# Patient Record
Sex: Female | Born: 1985 | Race: White | Hispanic: No | Marital: Single | Smoking: Former smoker
Health system: Southern US, Community
[De-identification: ages and names within clinical notes are randomized; demographics above are authoritative.]

## PROBLEM LIST (undated history)

## (undated) DIAGNOSIS — Z8052 Family history of malignant neoplasm of bladder: Secondary | ICD-10-CM

## (undated) DIAGNOSIS — N760 Acute vaginitis: Secondary | ICD-10-CM

## (undated) DIAGNOSIS — F4310 Post-traumatic stress disorder, unspecified: Secondary | ICD-10-CM

## (undated) DIAGNOSIS — Z801 Family history of malignant neoplasm of trachea, bronchus and lung: Secondary | ICD-10-CM

## (undated) DIAGNOSIS — F329 Major depressive disorder, single episode, unspecified: Secondary | ICD-10-CM

## (undated) DIAGNOSIS — Z800 Family history of malignant neoplasm of digestive organs: Secondary | ICD-10-CM

## (undated) DIAGNOSIS — F419 Anxiety disorder, unspecified: Secondary | ICD-10-CM

## (undated) DIAGNOSIS — Z808 Family history of malignant neoplasm of other organs or systems: Secondary | ICD-10-CM

## (undated) DIAGNOSIS — Z8049 Family history of malignant neoplasm of other genital organs: Secondary | ICD-10-CM

## (undated) DIAGNOSIS — F909 Attention-deficit hyperactivity disorder, unspecified type: Secondary | ICD-10-CM

## (undated) DIAGNOSIS — F319 Bipolar disorder, unspecified: Secondary | ICD-10-CM

## (undated) DIAGNOSIS — F32A Depression, unspecified: Secondary | ICD-10-CM

## (undated) DIAGNOSIS — Z803 Family history of malignant neoplasm of breast: Secondary | ICD-10-CM

## (undated) DIAGNOSIS — N879 Dysplasia of cervix uteri, unspecified: Secondary | ICD-10-CM

## (undated) DIAGNOSIS — Z806 Family history of leukemia: Secondary | ICD-10-CM

## (undated) DIAGNOSIS — T7840XA Allergy, unspecified, initial encounter: Secondary | ICD-10-CM

## (undated) DIAGNOSIS — E039 Hypothyroidism, unspecified: Secondary | ICD-10-CM

## (undated) DIAGNOSIS — B9689 Other specified bacterial agents as the cause of diseases classified elsewhere: Secondary | ICD-10-CM

## (undated) HISTORY — DX: Major depressive disorder, single episode, unspecified: F32.9

## (undated) HISTORY — DX: Family history of malignant neoplasm of trachea, bronchus and lung: Z80.1

## (undated) HISTORY — DX: Allergy, unspecified, initial encounter: T78.40XA

## (undated) HISTORY — DX: Bipolar disorder, unspecified: F31.9

## (undated) HISTORY — DX: Family history of malignant neoplasm of digestive organs: Z80.0

## (undated) HISTORY — DX: Post-traumatic stress disorder, unspecified: F43.10

## (undated) HISTORY — DX: Family history of leukemia: Z80.6

## (undated) HISTORY — DX: Family history of malignant neoplasm of breast: Z80.3

## (undated) HISTORY — DX: Depression, unspecified: F32.A

## (undated) HISTORY — DX: Hypothyroidism, unspecified: E03.9

## (undated) HISTORY — DX: Attention-deficit hyperactivity disorder, unspecified type: F90.9

## (undated) HISTORY — DX: Family history of malignant neoplasm of other genital organs: Z80.49

## (undated) HISTORY — DX: Dysplasia of cervix uteri, unspecified: N87.9

## (undated) HISTORY — DX: Anxiety disorder, unspecified: F41.9

## (undated) HISTORY — DX: Other specified bacterial agents as the cause of diseases classified elsewhere: B96.89

## (undated) HISTORY — PX: TUBAL LIGATION: SHX77

## (undated) HISTORY — DX: Other specified bacterial agents as the cause of diseases classified elsewhere: N76.0

## (undated) HISTORY — DX: Family history of malignant neoplasm of bladder: Z80.52

## (undated) HISTORY — DX: Family history of malignant neoplasm of other organs or systems: Z80.8

---

## 2007-07-17 DIAGNOSIS — K089 Disorder of teeth and supporting structures, unspecified: Secondary | ICD-10-CM | POA: Insufficient documentation

## 2013-06-14 ENCOUNTER — Encounter (HOSPITAL_COMMUNITY): Payer: Self-pay

## 2013-06-14 ENCOUNTER — Ambulatory Visit (HOSPITAL_COMMUNITY)
Admission: RE | Admit: 2013-06-14 | Discharge: 2013-06-14 | Disposition: A | Discharge status: Home / Self Care | Payer: Medicaid Other | Source: Ambulatory Visit | Attending: Unknown Physician Specialty | Admitting: Unknown Physician Specialty

## 2013-06-14 ENCOUNTER — Other Ambulatory Visit (HOSPITAL_COMMUNITY): Payer: Self-pay | Admitting: Unknown Physician Specialty

## 2013-06-14 ENCOUNTER — Other Ambulatory Visit: Payer: Self-pay

## 2013-06-14 ENCOUNTER — Ambulatory Visit (HOSPITAL_COMMUNITY)
Admission: RE | Admit: 2013-06-14 | Discharge: 2013-06-14 | Disposition: A | Discharge status: Home / Self Care | Payer: Medicaid Other | Source: Ambulatory Visit | Attending: Obstetrics and Gynecology | Admitting: Obstetrics and Gynecology

## 2013-06-14 DIAGNOSIS — E079 Disorder of thyroid, unspecified: Secondary | ICD-10-CM | POA: Insufficient documentation

## 2013-06-14 DIAGNOSIS — O368390 Maternal care for abnormalities of the fetal heart rate or rhythm, unspecified trimester, not applicable or unspecified: Secondary | ICD-10-CM

## 2013-06-14 DIAGNOSIS — O99280 Endocrine, nutritional and metabolic diseases complicating pregnancy, unspecified trimester: Principal | ICD-10-CM

## 2013-06-14 DIAGNOSIS — Z3689 Encounter for other specified antenatal screening: Secondary | ICD-10-CM | POA: Insufficient documentation

## 2013-06-14 DIAGNOSIS — E0590 Thyrotoxicosis, unspecified without thyrotoxic crisis or storm: Secondary | ICD-10-CM | POA: Insufficient documentation

## 2013-06-14 NOTE — Consult Note (Signed)
MFM Staff Consultation Note  Discussion: This patient is referred for evaluation for possible hyperthyroidism.  Detailed review of thyroid function panel drawn on 05/24/13 demonstrates the following:     Mildly low TSH of 0.427 (normal 0.450-4.50) and normal free thyroxine index (free T4) of 2.6 (normal 1.2-4.9).  These results are consistent with subclinical hyperthyroidism and may be related to HCG-effects of pregnancy.  Moreover, subclinical hyperthyroidism has been reported in 1.7% of pregnant women and is characterized by an abnormally low serum TSH concentration with free T4 levels within the normal reference range. Importantly, it has not been associated with adverse pregnancy outcomes. Because antithyroid medication crosses the placenta and could theoretically have adverse fetal or neonatal effects, treatment of pregnant women with subclinical hyperthyroidism is not warranted.   Impressions: SIUP at 6368w2d in gestation complicated by subclinical hyperthyroidism (low TSH, normal free T4)   Comprehensive fetal survey EFW 28th%, AC 6th%,  symmetric fetal growth pattern with HC/AC 1.09 UA Doppler 43rd% AFI is 9cm (low normal) No dysmorphic features    Recommendations: 1. Taking methimazole is not recommended at this time.  Patient advised not to take the medication. 2. trends in fetal growth lead me to recommend close interval follow up with reassessment of growth/AFI and Doppler only if needed in 2 weeks. 3. will repeat TSH and free T4 at that time to assess for onset of clinical hyperthyroidism onset (~5 weeks since the last draw, given that it typically takes 5-6 half lives for change in TSH/fT4; last draw 05/24/13).  If values remain subclinical and there are no overt signs of hyperthyroidism, I would state that thyroid function need not be reassessed unless clinically warranted until 4-6 weeks postpartum.  Time Spent: I spent in excess of 30 minutes in consultation with this patient  to review records, evaluate her case, and provide her with an adequate discussion and education.  More than 50% of this time was spent in direct face-to-face counseling. It was a pleasure seeing your patient in the office today.  Thank you for consultation. Please do not hesitate to contact our service for any further questions.   Thank you,  Louann SjogrenJeffrey Morgan Gaynelle Arabianenney   Orvella Digiulio, Louann SjogrenJeffrey Morgan, MD, MS, FACOG Assistant Professor Section of Maternal-Fetal Medicine [facility]

## 2013-06-18 ENCOUNTER — Other Ambulatory Visit (HOSPITAL_COMMUNITY): Payer: Self-pay | Admitting: Obstetrics and Gynecology

## 2013-06-18 DIAGNOSIS — E079 Disorder of thyroid, unspecified: Secondary | ICD-10-CM

## 2013-06-18 DIAGNOSIS — O99280 Endocrine, nutritional and metabolic diseases complicating pregnancy, unspecified trimester: Principal | ICD-10-CM

## 2013-06-28 ENCOUNTER — Ambulatory Visit (HOSPITAL_COMMUNITY): Admission: RE | Admit: 2013-06-28 | Payer: Medicaid Other | Source: Ambulatory Visit

## 2013-06-28 ENCOUNTER — Ambulatory Visit (HOSPITAL_COMMUNITY)
Admission: RE | Admit: 2013-06-28 | Discharge: 2013-06-28 | Disposition: A | Discharge status: Home / Self Care | Payer: Medicaid Other | Source: Ambulatory Visit | Attending: Unknown Physician Specialty | Admitting: Unknown Physician Specialty

## 2013-06-28 DIAGNOSIS — Z3689 Encounter for other specified antenatal screening: Secondary | ICD-10-CM | POA: Insufficient documentation

## 2013-06-28 DIAGNOSIS — E079 Disorder of thyroid, unspecified: Secondary | ICD-10-CM | POA: Insufficient documentation

## 2013-06-28 DIAGNOSIS — E0590 Thyrotoxicosis, unspecified without thyrotoxic crisis or storm: Secondary | ICD-10-CM | POA: Insufficient documentation

## 2013-06-28 DIAGNOSIS — O99280 Endocrine, nutritional and metabolic diseases complicating pregnancy, unspecified trimester: Principal | ICD-10-CM

## 2013-06-28 NOTE — Progress Notes (Signed)
[facility]  Indication: 28 yr old [id number] at 3116w2d with subclinical hyperthyroidism for follow up ultrasound for growth and complete anatomy.  Findings: 1. Single intrauterine pregnancy. 2. Estimated fetal weight is in the 29th%. 3. Anterior placenta without evidence of previa. 4. Normal amniotic fluid index. 5. The view of the abdominal cord insertion remains limited. 6. The remainder of the limited anatomy survey is normal. Any anatomy not evaluated on today's exam was evaluated on the previous exam.  Recommendations: 1. Appropriate fetal growth. 2. Normal limited anatomy survey. 3. Subclinical hyperthyroidism: - previously counseled - had repeat TFTs with primary OB- please call with questions - if free T4 remains normal no medical therapy indicated; would continue to follow every 4 weeks 3. Recommend fetal growth in 3-4 weeks  Eulis FosterKristen Antonia Culbertson, MD

## 2013-07-03 ENCOUNTER — Other Ambulatory Visit (HOSPITAL_COMMUNITY): Payer: Self-pay | Admitting: Obstetrics and Gynecology

## 2013-07-03 DIAGNOSIS — O99280 Endocrine, nutritional and metabolic diseases complicating pregnancy, unspecified trimester: Principal | ICD-10-CM

## 2013-07-03 DIAGNOSIS — E079 Disorder of thyroid, unspecified: Secondary | ICD-10-CM

## 2013-07-23 ENCOUNTER — Ambulatory Visit (HOSPITAL_COMMUNITY): Payer: Medicaid Other

## 2013-12-30 ENCOUNTER — Encounter (HOSPITAL_COMMUNITY): Payer: Self-pay

## 2014-04-19 ENCOUNTER — Encounter (HOSPITAL_COMMUNITY): Payer: Self-pay | Admitting: *Deleted

## 2018-01-09 ENCOUNTER — Encounter: Payer: Self-pay | Admitting: Internal Medicine

## 2018-01-09 ENCOUNTER — Ambulatory Visit: Payer: Medicaid Other | Admitting: Internal Medicine

## 2018-01-09 VITALS — BP 118/74 | HR 96 | Ht 65.0 in | Wt 156.0 lb

## 2018-01-09 DIAGNOSIS — N760 Acute vaginitis: Secondary | ICD-10-CM | POA: Diagnosis present

## 2018-01-09 DIAGNOSIS — B9689 Other specified bacterial agents as the cause of diseases classified elsewhere: Secondary | ICD-10-CM

## 2018-01-09 MED ORDER — TINIDAZOLE 500 MG PO TABS: 1.0000 g | ORAL_TABLET | Freq: Every day | ORAL | 3 refills | Status: AC

## 2018-01-09 NOTE — Patient Instructions (Signed)
Please use female condoms when having sex.  Abstain from sex during your menstrual cycle  If having symptoms, use tinidazole prescription

## 2018-01-09 NOTE — Progress Notes (Signed)
Recently finished Flagyl one week ago.  Bacterial Vaginitis s/s ongoing for +3years

## 2018-01-14 NOTE — Progress Notes (Signed)
RFV: referral for management of recurrent BV  Patient ID: Allison Nunez, female   DOB: 1985-04-07, 32 y.o.   MRN: [id number]  HPI 32yo F who reports that she has had ongoing intermittent recurrence of BV for the past 3 years. Has had multiple courses of metronidazole, tinidazole in order to treat BV . Often recurs by the next menstrual cycle. She feels that her symptoms occur about her menstrual cycle. She did not tolerate clindamycin or boric acid for which she did not complete. She has been with same partner for the past 6 yrs they have sex twice a week and often when she has her menstrual cycle. They do not use condoms since her female partner prefers not to use them. Referred here for other treatment course alternatives. Has had recent sti work up which is negative  I have reviewed her clinic notes   Outpatient Encounter Medications as of 01/09/2018  Medication Sig  . ARIPiprazole (ABILIFY) 2 MG tablet Take 1 tablet by mouth once.  . tinidazole (TINDAMAX) 500 MG tablet Take 2 tablets (1,000 mg total) by mouth daily with breakfast for 5 days.   No facility-administered encounter medications on file as of 01/09/2018.      There are no active problems to display for this patient.    Health Maintenance Due  Topic Date Due  . HIV Screening  08/12/2002  . TETANUS/TDAP  08/12/2006  . PAP SMEAR  08/11/2008  . INFLUENZA VACCINE  09/28/2017    family history is not on file.  Social History   Tobacco Use  . Smoking status: Heavy Tobacco Smoker    Packs/day: 1.00    Years: 16.00    Pack years: 16.00    Types: Cigarettes  . Smokeless tobacco: Never Used  Substance Use Topics  . Alcohol use: Not on file  . Drug use: Not on file   Review of Systems Review of Systems  Constitutional: Negative for fever, chills, diaphoresis, activity change, appetite change, fatigue and unexpected weight change.  HENT: Negative for congestion, sore throat, rhinorrhea, sneezing, trouble swallowing and  sinus pressure.  Eyes: Negative for photophobia and visual disturbance.  Respiratory: Negative for cough, chest tightness, shortness of breath, wheezing and stridor.  Cardiovascular: Negative for chest pain, palpitations and leg swelling.  Gastrointestinal: Negative for nausea, vomiting, abdominal pain, diarrhea, constipation, blood in stool, abdominal distention and anal bleeding.  Genitourinary: Negative for dysuria, hematuria, flank pain and difficulty urinating.  Musculoskeletal: Negative for myalgias, back pain, joint swelling, arthralgias and gait problem.  Skin: Negative for color change, pallor, rash and wound.  Neurological: Negative for dizziness, tremors, weakness and light-headedness.  Hematological: Negative for adenopathy. Does not bruise/bleed easily.  Psychiatric/Behavioral: Negative for behavioral problems, confusion, sleep disturbance, dysphoric mood, decreased concentration and agitation.    Physical Exam   BP 118/74   Pulse 96   Ht 5\' 5"  (1.651 m)   Wt 156 lb (70.8 kg)   LMP 12/17/2017   BMI 25.96 kg/m   Physical Exam  Constitutional:  oriented to person, place, and time. appears well-developed and well-nourished. No distress.  HENT: [place]/AT, PERRLA, no scleral icterus Mouth/Throat: Oropharynx is clear and moist. No oropharyngeal exudate.  Cardiovascular: Normal rate, regular rhythm and normal heart sounds. Exam reveals no gallop and no friction rub.  No murmur heard.  Pulmonary/Chest: Effort normal and breath sounds normal. No respiratory distress.  has no wheezes.  Neck = supple, no nuchal rigidity Abdominal: Soft. Bowel sounds are normal.  exhibits no  distension. There is no tenderness.  Lymphadenopathy: no cervical adenopathy. No axillary adenopathy Neurological: alert and oriented to person, place, and time.  Skin: Skin is warm and dry. No rash noted. No erythema.  Psychiatric: a normal mood and affect.  behavior is normal.    Assessment and  Plan  Recurrent BV = will try to have her abstain from sex during her menstrual cycle to see if that makes any difference with next bout of BV. Will also ask her to do female condom when she is having sex to see if that also makes any possible difference. She is given rx for tinidazole to start tx if symptoms to start soon. She is instructed to call when her symptoms start so that can decide to screen in clinic for BV.

## 2018-01-22 ENCOUNTER — Telehealth: Payer: Self-pay | Admitting: *Deleted

## 2018-01-22 NOTE — Telephone Encounter (Signed)
[facility] called for clarification regarding Tinidazole 500 mg; take 1,000 mg Oral Daily with breakfast for 5 days. [facility] needed verbal order for 10 pills to complete the prescription for 5 days. Allison CossHowell, Allison Hodges M, RN

## 2018-03-05 ENCOUNTER — Ambulatory Visit: Payer: Medicaid Other | Admitting: Internal Medicine

## 2018-03-05 ENCOUNTER — Encounter: Payer: Self-pay | Admitting: Internal Medicine

## 2018-03-05 VITALS — BP 117/74 | HR 89 | Temp 98.2°F | Wt 149.0 lb

## 2018-03-05 DIAGNOSIS — N921 Excessive and frequent menstruation with irregular cycle: Secondary | ICD-10-CM

## 2018-03-05 DIAGNOSIS — N760 Acute vaginitis: Secondary | ICD-10-CM

## 2018-03-05 MED ORDER — TINIDAZOLE 500 MG PO TABS: 500.0000 mg | ORAL_TABLET | Freq: Every day | ORAL | 0 refills | Status: AC

## 2018-03-05 NOTE — Progress Notes (Signed)
    Patient ID: Allison Nunez, female   DOB: 1985/10/21, 33 y.o.   MRN: [id number]  HPI Allison Nunez is a 33 yo F with hx of chronic vaginitis, recurrent BV. She was referred to the clinic in mid November for management of recurrent BV. At that time, we discussed doing a trial of tinidazole and using condoms with her partner since she stated that she often had symptoms after having sex.  She is now here in follow up, having irregularly bleeding. Menorrhagia- dec 3rd. Not heavy, but having clots, "old blood". Lower abdominal cramping. She describes vaginal bleeding as different from her baseline menses, less bright red blood and passing ?dark clots.   No difference with tinidazole, however, the patient has had ongoing menses for which she is unable to determine if symptoms improved.   Has only seen PCP -> but has   Lats saw cris richardson from [facility] in march 2019 for crhonic vaginiits. Treated with fluconazole and tinidazole at that time. Patient also has hx of using boric acid without success  Outpatient Encounter Medications as of 03/05/2018  Medication Sig  . ARIPiprazole (ABILIFY) 2 MG tablet Take 1 tablet by mouth once.   No facility-administered encounter medications on file as of 03/05/2018.      There are no active problems to display for this patient.   Health Maintenance Due  Topic Date Due  . HIV Screening  08/12/2002  . TETANUS/TDAP  08/12/2006  . PAP SMEAR-Modifier  08/11/2008  . INFLUENZA VACCINE  09/28/2017     Review of Systems Per hpi, otherwise 12 point ros is negative Physical Exam   BP 117/74   Pulse 89   Temp 98.2 F (36.8 C)   Wt 149 lb (67.6 kg)   LMP 01/30/2018   BMI 24.79 kg/m   gen = a x o by 3 in nad HEENT= MMM, PERRLA, EOMI Pulm= cTAB Cors = nl s1,s2 no g/m/r Abd= NTND, BS+ Skin = no signs of rash  Assessment and Plan Menorrhagia =  Refer [facility] at [facility] hospital clinic vs. Her having follow up fwith her  previous provider.   Referral would be - would be 2 fold to address menorrhagia and to address recurrent BV.  Recurrent BV= will give one refill of tinidazole for future use. preferrably needs ot be retested but in the setting of current menorrhagia, would like to be seen by ob/gyn  rtc prn

## 2018-03-14 ENCOUNTER — Telehealth: Payer: Self-pay

## 2018-03-14 NOTE — Telephone Encounter (Signed)
Patient called requesting refill for Tinidazole.  Per Dr. Drue Second patient needs to follow up with PCP to be tested and also follow up with referred GYN. Patient was understanding and agreed with Dr. Drue Second.  S.Elmyra Banwart,LPN

## 2018-04-09 ENCOUNTER — Encounter: Payer: Medicaid Other | Admitting: Obstetrics & Gynecology

## 2018-04-24 ENCOUNTER — Ambulatory Visit (INDEPENDENT_AMBULATORY_CARE_PROVIDER_SITE_OTHER): Payer: Medicaid Other | Admitting: Obstetrics & Gynecology

## 2018-04-24 ENCOUNTER — Encounter: Payer: Self-pay | Admitting: Obstetrics & Gynecology

## 2018-04-24 VITALS — BP 96/67 | HR 108 | Wt 151.8 lb

## 2018-04-24 DIAGNOSIS — N938 Other specified abnormal uterine and vaginal bleeding: Secondary | ICD-10-CM | POA: Diagnosis present

## 2018-04-24 LAB — CBC
HEMATOCRIT: 42.1 % (ref 34.0–46.6)
HEMOGLOBIN: 13.8 g/dL (ref 11.1–15.9)
MCH: 30.3 pg (ref 26.6–33.0)
MCHC: 32.8 g/dL (ref 31.5–35.7)
MCV: 92 fL (ref 79–97)
Platelets: 202 10*3/uL (ref 150–450)
RBC: 4.56 x10E6/uL (ref 3.77–5.28)
RDW: 11.8 % (ref 11.7–15.4)
WBC: 14.6 10*3/uL — AB (ref 3.4–10.8)

## 2018-04-24 NOTE — Progress Notes (Signed)
   Subjective:    Patient ID: Allison Nunez, female    DOB: Dec 12, 1985, 33 y.o.   MRN: [id number]  HPI 33 yo single P5 (14, 9, 31, 8, and 61 yo kids) here today with 33 issues: recurrent BV and irregular bleeding. She has been seen in [place] by a gyn, by her primary care, and by ID for this issue. This has been going on for about 3 years. She has used multiple doses of flagyl and tinidizole. She tried boric acid supp for 5 days but stopped because it burned. These were made by the pharmacist. She used probiotics for 30 days. This occurs when she is bleeding, which is basically all the time.  The bleeding is almost daily for almost 3 years. She has tried OCPs. She has not had an u/s yet. She has hyperthyroidism. She sees a primary care for this issues. She says that her most recent TSH was normal.  Review of Systems Had a BTL Monogamous for 6 years She had negative cervical cultures 12/19 and this year as well    Objective:   Physical Exam Breathing, conversing, and ambulating normally Well nourished, well hydrated White female, no apparent distress Speculum exam reveals frothy discharge c/w BV Bimanual exam - normal size and shape, anteverted, mobile,-tender, normal adnexal exam      Assessment & Plan:  BV- rec boric acid [facility] from [place] [facility]- check CBC, gyn u/s Come back after u/s

## 2018-04-30 ENCOUNTER — Ambulatory Visit (HOSPITAL_COMMUNITY): Payer: Medicaid Other

## 2018-05-04 ENCOUNTER — Ambulatory Visit (HOSPITAL_COMMUNITY)
Admission: RE | Admit: 2018-05-04 | Discharge: 2018-05-04 | Disposition: A | Discharge status: Home / Self Care | Payer: Medicaid Other | Source: Ambulatory Visit | Attending: Obstetrics & Gynecology | Admitting: Obstetrics & Gynecology

## 2018-05-04 DIAGNOSIS — N938 Other specified abnormal uterine and vaginal bleeding: Secondary | ICD-10-CM | POA: Diagnosis not present

## 2018-05-18 ENCOUNTER — Telehealth: Payer: Self-pay | Admitting: Obstetrics & Gynecology

## 2018-05-18 NOTE — Telephone Encounter (Signed)
The patient stated she has her kids and has to reschedule the appointment. Also stated she would like a nurse to call her with the results.

## 2018-05-21 ENCOUNTER — Ambulatory Visit: Payer: Medicaid Other | Admitting: Obstetrics & Gynecology

## 2018-05-21 ENCOUNTER — Other Ambulatory Visit: Payer: Self-pay | Admitting: Obstetrics & Gynecology

## 2018-05-21 DIAGNOSIS — N83201 Unspecified ovarian cyst, right side: Secondary | ICD-10-CM

## 2018-05-21 NOTE — Telephone Encounter (Addendum)
Discussed with Dr. Marice Potter and I called patient and informed her per Dr. Marice Potter that her US shows a right ovarian cyst and followup US recommended in 12 weeks. I informed her of the [place] appt for 08/14/18. I also informed her per Dr.Dove that cyst does not explain her bleeding and she will need an endometrial biopsy for that- but is not urgent and registrar will call her for appointment in the next 1- 2 months. She voices understanding.

## 2018-05-21 NOTE — Progress Notes (Unsigned)
Patient called for u/s results I discussed this with Raynald Blend, RN. She will notify patient of this including the need for a EMBX in the not too soon future and a follow up u/s for right ovarian cyst in about 12 weeks.

## 2018-05-21 NOTE — Telephone Encounter (Addendum)
I called Allison Nunez and she asked for her lab and ultrasound results.  I informed her CBC on 2/s5/ 20 was normal except for slightly elevated WBC. I also explained I could not verify Dr. Had reviewed ultrasound so I would check with provider and we will either call her back or send MyChart message. She voiced understanding.

## 2018-06-08 ENCOUNTER — Other Ambulatory Visit: Payer: Self-pay

## 2018-06-08 DIAGNOSIS — B9689 Other specified bacterial agents as the cause of diseases classified elsewhere: Secondary | ICD-10-CM

## 2018-06-08 DIAGNOSIS — N760 Acute vaginitis: Principal | ICD-10-CM

## 2018-06-08 MED ORDER — TINIDAZOLE 500 MG PO TABS: ORAL_TABLET | ORAL | 0 refills | Status: DC

## 2018-08-14 ENCOUNTER — Ambulatory Visit (HOSPITAL_COMMUNITY): Payer: Medicaid Other

## 2018-08-20 ENCOUNTER — Ambulatory Visit (HOSPITAL_COMMUNITY): Payer: Medicaid Other

## 2018-08-29 ENCOUNTER — Other Ambulatory Visit: Payer: Self-pay

## 2018-08-29 ENCOUNTER — Ambulatory Visit (HOSPITAL_COMMUNITY)
Admission: RE | Admit: 2018-08-29 | Discharge: 2018-08-29 | Disposition: A | Discharge status: Home / Self Care | Payer: Medicaid Other | Source: Ambulatory Visit | Attending: Obstetrics & Gynecology | Admitting: Obstetrics & Gynecology

## 2018-08-29 DIAGNOSIS — N83201 Unspecified ovarian cyst, right side: Secondary | ICD-10-CM | POA: Diagnosis present

## 2018-09-03 DIAGNOSIS — N760 Acute vaginitis: Secondary | ICD-10-CM

## 2018-09-03 DIAGNOSIS — B9689 Other specified bacterial agents as the cause of diseases classified elsewhere: Secondary | ICD-10-CM

## 2018-09-03 MED ORDER — TINIDAZOLE 500 MG PO TABS: 2.0000 g | ORAL_TABLET | Freq: Every day | ORAL | 0 refills | Status: AC

## 2018-12-25 ENCOUNTER — Ambulatory Visit (INDEPENDENT_AMBULATORY_CARE_PROVIDER_SITE_OTHER): Payer: Medicaid Other | Admitting: Psychiatry

## 2018-12-25 ENCOUNTER — Other Ambulatory Visit: Payer: Self-pay

## 2018-12-25 ENCOUNTER — Telehealth: Payer: Self-pay

## 2018-12-25 ENCOUNTER — Encounter: Payer: Self-pay | Admitting: Psychiatry

## 2018-12-25 DIAGNOSIS — F4310 Post-traumatic stress disorder, unspecified: Secondary | ICD-10-CM | POA: Diagnosis not present

## 2018-12-25 DIAGNOSIS — Z9189 Other specified personal risk factors, not elsewhere classified: Secondary | ICD-10-CM

## 2018-12-25 DIAGNOSIS — F1021 Alcohol dependence, in remission: Secondary | ICD-10-CM

## 2018-12-25 DIAGNOSIS — F3162 Bipolar disorder, current episode mixed, moderate: Secondary | ICD-10-CM | POA: Insufficient documentation

## 2018-12-25 DIAGNOSIS — F17200 Nicotine dependence, unspecified, uncomplicated: Secondary | ICD-10-CM | POA: Diagnosis not present

## 2018-12-25 DIAGNOSIS — Z8659 Personal history of other mental and behavioral disorders: Secondary | ICD-10-CM

## 2018-12-25 DIAGNOSIS — Z79899 Other long term (current) drug therapy: Secondary | ICD-10-CM

## 2018-12-25 MED ORDER — ZIPRASIDONE HCL 40 MG PO CAPS: 40.0000 mg | ORAL_CAPSULE | Freq: Every day | ORAL | 1 refills | Status: DC

## 2018-12-25 MED ORDER — TRAZODONE HCL 50 MG PO TABS: 50.0000 mg | ORAL_TABLET | Freq: Every evening | ORAL | 1 refills | Status: DC | PRN
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
## 2018-12-25 NOTE — Telephone Encounter (Signed)
labwork order and ekg order mailed out

## 2019-01-16 ENCOUNTER — Other Ambulatory Visit: Payer: Self-pay

## 2019-01-16 ENCOUNTER — Encounter: Payer: Self-pay | Admitting: Psychiatry

## 2019-01-16 ENCOUNTER — Ambulatory Visit (INDEPENDENT_AMBULATORY_CARE_PROVIDER_SITE_OTHER): Payer: Medicaid Other | Admitting: Psychiatry

## 2019-01-16 DIAGNOSIS — F17200 Nicotine dependence, unspecified, uncomplicated: Secondary | ICD-10-CM

## 2019-01-16 DIAGNOSIS — Z8659 Personal history of other mental and behavioral disorders: Secondary | ICD-10-CM | POA: Diagnosis not present

## 2019-01-16 DIAGNOSIS — F4310 Post-traumatic stress disorder, unspecified: Secondary | ICD-10-CM

## 2019-01-16 DIAGNOSIS — F3162 Bipolar disorder, current episode mixed, moderate: Secondary | ICD-10-CM

## 2019-01-16 DIAGNOSIS — F1021 Alcohol dependence, in remission: Secondary | ICD-10-CM

## 2019-01-16 MED ORDER — ARIPIPRAZOLE 5 MG PO TABS: 5.0000 mg | ORAL_TABLET | Freq: Every day | ORAL | 1 refills | Status: DC
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
## 2019-01-31 ENCOUNTER — Other Ambulatory Visit: Payer: Self-pay

## 2019-01-31 ENCOUNTER — Ambulatory Visit (INDEPENDENT_AMBULATORY_CARE_PROVIDER_SITE_OTHER): Payer: Medicaid Other | Admitting: Psychiatry

## 2019-01-31 DIAGNOSIS — Z5329 Procedure and treatment not carried out because of patient's decision for other reasons: Secondary | ICD-10-CM | POA: Insufficient documentation

## 2019-01-31 DIAGNOSIS — Z91199 Patient's noncompliance with other medical treatment and regimen due to unspecified reason: Secondary | ICD-10-CM | POA: Insufficient documentation

## 2019-01-31 NOTE — Progress Notes (Signed)
No response to call or text. 

## 2019-02-07 ENCOUNTER — Other Ambulatory Visit: Payer: Self-pay

## 2019-02-07 ENCOUNTER — Ambulatory Visit (INDEPENDENT_AMBULATORY_CARE_PROVIDER_SITE_OTHER): Payer: Medicaid Other | Admitting: Psychiatry

## 2019-02-07 ENCOUNTER — Encounter: Payer: Self-pay | Admitting: Psychiatry

## 2019-02-07 DIAGNOSIS — F4310 Post-traumatic stress disorder, unspecified: Secondary | ICD-10-CM | POA: Diagnosis not present

## 2019-02-07 DIAGNOSIS — F3177 Bipolar disorder, in partial remission, most recent episode mixed: Secondary | ICD-10-CM | POA: Diagnosis not present

## 2019-02-07 DIAGNOSIS — F1021 Alcohol dependence, in remission: Secondary | ICD-10-CM

## 2019-02-07 DIAGNOSIS — Z8659 Personal history of other mental and behavioral disorders: Secondary | ICD-10-CM | POA: Diagnosis not present

## 2019-02-07 DIAGNOSIS — F17200 Nicotine dependence, unspecified, uncomplicated: Secondary | ICD-10-CM

## 2019-02-07 MED ORDER — TRAZODONE HCL 50 MG PO TABS: 50.0000 mg | ORAL_TABLET | Freq: Every evening | ORAL | 1 refills | Status: DC | PRN

## 2019-02-07 NOTE — Progress Notes (Signed)
Virtual Visit via Video Note  I connected with Allison Nunez on 02/07/19 at 10:00 AM EST by a video enabled telemedicine application and verified that I am speaking with the correct person using two identifiers.   I discussed the limitations of evaluation and management by telemedicine and the availability of in person appointments. The patient expressed understanding and agreed to proceed.     I discussed the assessment and treatment plan with the patient. The patient was provided an opportunity to ask questions and all were answered. The patient agreed with the plan and demonstrated an understanding of the instructions.   The patient was advised to call back or seek an in-person evaluation if the symptoms worsen or if the condition fails to improve as anticipated.   BH MD OP Progress Note  02/07/2019 12:33 PM Allison Nunez  MRN:  [id number]  Chief Complaint:  Chief Complaint    Follow-up     HPI: Allison Nunez is a 33 year old Caucasian female, single, lives in [place], employed, has a history of bipolar disorder, ADHD, alcohol use disorder in remission was evaluated by telemedicine today.  Patient today reports she had a very good Thanksgiving holiday.  She enjoyed with her family.  She however today reports she has been mildly anxious the past few days.  It is mostly because of job related stressors.  Her hours were cut and she is worried about her financial situation.  She however reports even though she felt hopeless and suicidal initially when this happened, 3 weeks ago, she currently feels much better.  She currently denies any suicidality.  She reports she feels more positive and is actively looking for another job.  She is trying to find something so she can sustain her family.  Patient reports medications as effective.  She is compliant on them as prescribed.  She reports sleep is improved.  Patient denies any suicidality, homicidality or perceptual disturbances.  Patient does  report that she was able to find [facility] and is motivated to start therapy sessions soon.  Patient also has upcoming appointment with primary care provider to do her labs as well as EKG.  Patient continues to stay away from alcohol or any other drugs.  She denies any other concerns today. Visit Diagnosis:    ICD-10-CM   1. Bipolar disorder, in partial remission, most recent episode mixed (HCC)  F31.77 traZODone (DESYREL) 50 MG tablet  2. PTSD (post-traumatic stress disorder)  F43.10 traZODone (DESYREL) 50 MG tablet  3. History of ADHD  Z86.59   4. Tobacco use disorder  F17.200   5. Alcohol use disorder, moderate, in sustained remission (HCC)  F10.21     Past Psychiatric History: I have reviewed past psychiatric history from my progress note on 12/25/2018.  Past trials of Depakote-hair loss, Seroquel, Prozac, Wellbutrin, risperidone, Adderall, Ritalin, Geodon  Past Medical History:  Past Medical History:  Diagnosis Date  . ADHD (attention deficit hyperactivity disorder)   . Anxiety   . BV (bacterial vaginosis)   . Cervical dysplasia   . Depression   . Hypothyroidism     Past Surgical History:  Procedure Laterality Date  . TUBAL LIGATION      Family Psychiatric History: I have reviewed family psychiatric history from my progress note on 12/25/2018.  Family History:  Family History  Problem Relation Age of Onset  . Anxiety disorder Sister   . Depression Sister   . Alcohol abuse Brother   . Drug abuse Brother  Social History: Reviewed social history from my progress note on 12/25/2018. Social History   Socioeconomic History  . Marital status: Single    Spouse name: Not on file  . Number of children: 5  . Years of education: Not on file  . Highest education level: High school graduate  Occupational History  . Not on file  Tobacco Use  . Smoking status: Current Every Day Smoker    Packs/day: 1.00    Years: 16.00    Pack years: 16.00     Types: Cigarettes  . Smokeless tobacco: Never Used  . Tobacco comment: 3 cigarettes a day  Substance and Sexual Activity  . Alcohol use: Not Currently  . Drug use: Never  . Sexual activity: Yes    Birth control/protection: Condom  Other Topics Concern  . Not on file  Social History Narrative  . Not on file   Social Determinants of Health   Financial Resource Strain: Medium Risk  . Difficulty of Paying Living Expenses: Somewhat hard  Food Insecurity: Food Insecurity Present  . Worried About Charity fundraiser in the Last Year: Sometimes true  . Ran Out of Food in the Last Year: Sometimes true  Transportation Needs: No Transportation Needs  . Lack of Transportation (Medical): No  . Lack of Transportation (Non-Medical): No  Physical Activity: Inactive  . Days of Exercise per Week: 0 days  . Minutes of Exercise per Session: 0 min  Stress: Stress Concern Present  . Feeling of Stress : Rather much  Social Connections: Unknown  . Frequency of Communication with Friends and Family: Not on file  . Frequency of Social Gatherings with Friends and Family: Not on file  . Attends Religious Services: Never  . Active Member of Clubs or Organizations: No  . Attends Archivist Meetings: Never  . Marital Status: Never married    Allergies: No Known Allergies  Metabolic Disorder Labs: No results found for: HGBA1C, MPG No results found for: PROLACTIN No results found for: CHOL, TRIG, HDL, CHOLHDL, VLDL, LDLCALC No results found for: TSH  Therapeutic Level Labs: No results found for: LITHIUM No results found for: VALPROATE No components found for:  CBMZ  Current Medications: Current Outpatient Medications  Medication Sig Dispense Refill  . ARIPiprazole (ABILIFY) 5 MG tablet Take 1 tablet (5 mg total) by mouth daily. 30 tablet 1  . hydrOXYzine (ATARAX/VISTARIL) 25 MG tablet TK 1 T PO Q 8 H PRA    . traZODone (DESYREL) 50 MG tablet Take 1-2 tablets (50-100 mg total) by  mouth at bedtime as needed for sleep. 60 tablet 1   No current facility-administered medications for this visit.     Musculoskeletal: Strength & Muscle Tone: UTA Gait & Station: normal Patient leans: N/A  Psychiatric Specialty Exam: Review of Systems  Neurological: Positive for headaches (chronic -comes and goes).  Psychiatric/Behavioral: Positive for dysphoric mood. The patient is nervous/anxious.   All other systems reviewed and are negative.   There were no vitals taken for this visit.There is no height or weight on file to calculate BMI.  General Appearance: Casual  Eye Contact:  Fair  Speech:  Clear and Coherent  Volume:  Normal  Mood:  Anxious  Affect:  Congruent  Thought Process:  Goal Directed and Descriptions of Associations: Intact  Orientation:  Full (Time, Place, and Person)  Thought Content: Logical   Suicidal Thoughts:  No  Homicidal Thoughts:  No  Memory:  Immediate;   Fair Recent;  Fair Remote;   Fair  Judgement:  Fair  Insight:  Fair  Psychomotor Activity:  Normal  Concentration:  Concentration: Fair and Attention Span: Fair  Recall:  [employer] of Knowledge: Fair  Language: Fair  Akathisia:  No  Handed:  Right  AIMS (if indicated): Denies tremors, rigidity  Assets:  Communication Skills Desire for Improvement Housing Social Support  ADL's:  Intact  Cognition: WNL  Sleep:  Fair   Screenings: GAD-7     [facility]/24/2018 in [facility]  Total GAD-7 Score  18    PHQ2-9     Office Visit from 04/24/2018 in [facility] Office Visit from 01/09/2018 in [facility]ious Disease  PHQ-2 Total Score  4  0  PHQ-9 Total Score  12  -       Assessment and Plan: Saren is a 33 year old Caucasian female, employed, single, lives in [place], has a history of bipolar disorder, ADHD, history of trauma was evaluated by telemedicine today.  She is biologically  predisposed given her history of trauma, family history of mental health problems.  She also has psychosocial stressors of relationship struggles, being a single mother and job related stressors.  Patient is currently compliant on medications and is currently making progress.  She will benefit from medication management as well as psychotherapy sessions.  Plan as noted below.  Plan Bipolar disorder-improving Continue Abilify 5 mg p.o. daily Trazodone 50 to 100 mg p.o. nightly as needed  PTSD-improving She has established care with [facility] with [facility]. We will consider an SSRI in the future.  She currently declines. Trazodone as prescribed for sleep  History of ADHD-chronic-he is currently not on medications. Patient was advised to sign a release to obtain medical records from previous psychiatrist.  Tobacco use disorder-improving Patient is currently cutting back. Provided smoking cessation counseling  Alcohol use disorder in remission We will continue to monitor closely.   Pending labs-lipid panel, hemoglobin A1c, prolactin.  Pending EKG for QTC monitoring.  Follow up in clinic 4 weeks or sooner. Jan 12, 3:20 pm  I have spent atleast 15 minutes non face to face with patient today. More than 50 % of the time was spent for psychoeducation and supportive psychotherapy and care coordination. This note was generated in part or whole with voice recognition software. Voice recognition is usually quite accurate but there are transcription errors that can and very often do occur. I apologize for any typographical errors that were not detected and corrected.      Jomarie Longs, MD 02/07/2019, 12:33 PM

## 2019-02-14 ENCOUNTER — Telehealth: Payer: Self-pay | Admitting: Psychiatry

## 2019-02-14 NOTE — Telephone Encounter (Signed)
I have reviewed the following labs received from [facility]-dated 02/08/2019  CBC-WBC high at 15.6 Neutrophil absolute-high at 9.3 Lymphocytes-elevated at 4.7 Monocytes-elevated at 1.3  CMP- Glucose elevated at 101 Otherwise within normal limits  RPR-nonreactive  HIV-nonreactive  TSH-0.925-within normal limits  EKG-dated 12/27/2018  QTC-406 Normal sinus rhythm.  Patient to continue to work with her primary care provider for abnormal labs.

## 2019-03-12 ENCOUNTER — Other Ambulatory Visit: Payer: Self-pay

## 2019-03-12 ENCOUNTER — Encounter: Payer: Self-pay | Admitting: Psychiatry

## 2019-03-12 ENCOUNTER — Ambulatory Visit (INDEPENDENT_AMBULATORY_CARE_PROVIDER_SITE_OTHER): Payer: Medicaid Other | Admitting: Psychiatry

## 2019-03-12 DIAGNOSIS — F4310 Post-traumatic stress disorder, unspecified: Secondary | ICD-10-CM | POA: Diagnosis not present

## 2019-03-12 DIAGNOSIS — F3162 Bipolar disorder, current episode mixed, moderate: Secondary | ICD-10-CM | POA: Diagnosis not present

## 2019-03-12 DIAGNOSIS — Z8659 Personal history of other mental and behavioral disorders: Secondary | ICD-10-CM

## 2019-03-12 DIAGNOSIS — F17200 Nicotine dependence, unspecified, uncomplicated: Secondary | ICD-10-CM | POA: Diagnosis not present

## 2019-03-12 DIAGNOSIS — F1021 Alcohol dependence, in remission: Secondary | ICD-10-CM

## 2019-03-12 MED ORDER — ARIPIPRAZOLE 5 MG PO TABS: 5.0000 mg | ORAL_TABLET | Freq: Every day | ORAL | 1 refills | Status: DC

## 2019-03-12 MED ORDER — FLUOXETINE HCL 20 MG PO CAPS: 20.0000 mg | ORAL_CAPSULE | Freq: Every day | ORAL | 1 refills | Status: DC

## 2019-03-12 NOTE — Progress Notes (Signed)
Virtual Visit via Video Note  I connected with Allison Nunez on 03/12/19 at  3:20 PM EST by a video enabled telemedicine application and verified that I am speaking with the correct person using two identifiers.   I discussed the limitations of evaluation and management by telemedicine and the availability of in person appointments. The patient expressed understanding and agreed to proceed.     I discussed the assessment and treatment plan with the patient. The patient was provided an opportunity to ask questions and all were answered. The patient agreed with the plan and demonstrated an understanding of the instructions.   The patient was advised to call back or seek an in-person evaluation if the symptoms worsen or if the condition fails to improve as anticipated.  BH MD OP Progress Note  03/12/2019 4:53 PM Laylana Gerwig  MRN:  [id number]  Chief Complaint:  Chief Complaint    Follow-up     HPI: Allison Nunez is a 34 year old Caucasian female, single lives in [place], employed, has a history of bipolar disorder, ADHD, alcohol use disorder in remission, tobacco use disorder, PTSD was evaluated by telemedicine today.  Patient reports she just lost her grandmother who was 22 years old and currently he is back home to attend her funeral.  The funeral is scheduled for tomorrow.  She reports she is planning to return on Thursday.  Patient reports she does feel depressed, and has been having crying spells more so because of her recent loss.  Patient however reports she has appointment scheduled to start therapy sessions and she looks forward to that.  Patient reports she continues to have some intrusive memories about her past history of trauma on and off.  Patient reports sleep is good on the trazodone.  Patient denies any suicidality, homicidality or perceptual disturbances.  Patient reports she has started cutting back on smoking cigarettes.  She denies any other concerns today. Visit  Diagnosis:    ICD-10-CM   1. Bipolar 1 disorder, mixed, moderate (HCC)  F31.62 ARIPiprazole (ABILIFY) 5 MG tablet  2. PTSD (post-traumatic stress disorder)  F43.10 FLUoxetine (PROZAC) 20 MG capsule  3. History of ADHD  Z86.59   4. Tobacco use disorder  F17.200   5. Alcohol use disorder, moderate, in sustained remission (HCC)  F10.21     Past Psychiatric History: Reviewed past psychiatric history from my progress note on 12/25/2018.  Past trials of Depakote-hair loss, Seroquel, Prozac, Wellbutrin, risperidone, Adderall, Ritalin, Geodon.  Past Medical History:  Past Medical History:  Diagnosis Date  . ADHD (attention deficit hyperactivity disorder)   . Anxiety   . BV (bacterial vaginosis)   . Cervical dysplasia   . Depression   . Hypothyroidism     Past Surgical History:  Procedure Laterality Date  . TUBAL LIGATION      Family Psychiatric History: I have reviewed family psychiatric history from my progress note on 12/25/2018.  Family History:  Family History  Problem Relation Age of Onset  . Anxiety disorder Sister   . Depression Sister   . Alcohol abuse Brother   . Drug abuse Brother     Social History: Reviewed social history from my progress note on 12/25/2018. Social History   Socioeconomic History  . Marital status: Single    Spouse name: Not on file  . Number of children: 5  . Years of education: Not on file  . Highest education level: High school graduate  Occupational History  . Not on file  Tobacco Use  .  Smoking status: Current Every Day Smoker    Packs/day: 1.00    Years: 16.00    Pack years: 16.00    Types: Cigarettes  . Smokeless tobacco: Never Used  . Tobacco comment: 3 cigarettes a day  Substance and Sexual Activity  . Alcohol use: Not Currently  . Drug use: Never  . Sexual activity: Yes    Birth control/protection: Condom  Other Topics Concern  . Not on file  Social History Narrative  . Not on file   Social Determinants of Health    Financial Resource Strain: Medium Risk  . Difficulty of Paying Living Expenses: Somewhat hard  Food Insecurity: Food Insecurity Present  . Worried About Programme researcher, broadcasting/film/video in the Last Year: Sometimes true  . Ran Out of Food in the Last Year: Sometimes true  Transportation Needs: No Transportation Needs  . Lack of Transportation (Medical): No  . Lack of Transportation (Non-Medical): No  Physical Activity: Inactive  . Days of Exercise per Week: 0 days  . Minutes of Exercise per Session: 0 min  Stress: Stress Concern Present  . Feeling of Stress : Rather much  Social Connections: Unknown  . Frequency of Communication with Friends and Family: Not on file  . Frequency of Social Gatherings with Friends and Family: Not on file  . Attends Religious Services: Never  . Active Member of Clubs or Organizations: No  . Attends Banker Meetings: Never  . Marital Status: Never married    Allergies: No Known Allergies  Metabolic Disorder Labs: No results found for: HGBA1C, MPG No results found for: PROLACTIN No results found for: CHOL, TRIG, HDL, CHOLHDL, VLDL, LDLCALC No results found for: TSH  Therapeutic Level Labs: No results found for: LITHIUM No results found for: VALPROATE No components found for:  CBMZ  Current Medications: Current Outpatient Medications  Medication Sig Dispense Refill  . ARIPiprazole (ABILIFY) 5 MG tablet Take 1 tablet (5 mg total) by mouth daily. 30 tablet 1  . FLUoxetine (PROZAC) 20 MG capsule Take 1 capsule (20 mg total) by mouth daily. 30 capsule 1  . hydrOXYzine (ATARAX/VISTARIL) 25 MG tablet TK 1 T PO Q 8 H PRA    . traZODone (DESYREL) 50 MG tablet Take 1-2 tablets (50-100 mg total) by mouth at bedtime as needed for sleep. 60 tablet 1   No current facility-administered medications for this visit.     Musculoskeletal: Strength & Muscle Tone: UTA Gait & Station: normal Patient leans: N/A  Psychiatric Specialty Exam: Review of Systems   Psychiatric/Behavioral: Positive for dysphoric mood. The patient is nervous/anxious.   All other systems reviewed and are negative.   There were no vitals taken for this visit.There is no height or weight on file to calculate BMI.  General Appearance: Casual  Eye Contact:  Fair  Speech:  Normal Rate  Volume:  Normal  Mood:  Anxious and Depressed  Affect:  Congruent  Thought Process:  Goal Directed and Descriptions of Associations: Intact  Orientation:  Full (Time, Place, and Person)  Thought Content: Logical   Suicidal Thoughts:  No  Homicidal Thoughts:  No  Memory:  Immediate;   Fair Recent;   Fair Remote;   Fair  Judgement:  Fair  Insight:  Fair  Psychomotor Activity:  Normal  Concentration:  Concentration: Fair and Attention Span: Fair  Recall:  [employer] of Knowledge: Fair  Language: Fair  Akathisia:  No  Handed:  Right  AIMS (if indicated): Denies tremors, rigidity  Assets:  Communication Skills Desire for [place] Talents/Skills  ADL's:  Intact  Cognition: WNL  Sleep:  Fair   Screenings: GAD-7     Office Visit from 04/24/2018 in [place] [facility]  Total GAD-7 Score  18    PHQ2-9     Office Visit from 04/24/2018 in [place] [facility] Office Visit from 01/09/2018 in [facility] for Infectious Disease  PHQ-2 Total Score  4  0  PHQ-9 Total Score  12  --       Assessment and Plan: Sherryll is a 34 year old Caucasian female, employed, single, lives in [place], has a history of bipolar disorder, ADHD, history of trauma was evaluated by telemedicine today.  She is biologically predisposed given her history of trauma, family history of mental health problems.  She also has psychosocial stressors of relationship struggles, being a single mother and job related stressors.  Patient also lost her grandmother and is currently home attending her funeral.  Patient hence does report mood  symptoms and will benefit from medication readjustment.  Plan as noted below.  Plan Bipolar disorder-some progress Abilify 5 mg p.o. daily Trazodone 50 to 100 mg p.o. nightly as needed  PTSD-some progress Start Prozac 20 mg p.o. daily Patient has been referred for CBT Trazodone as prescribed for sleep  History of ADHD-chronic-she is currently not on medication. Patient was advised to sign a release to obtain medical records.  Tobacco use disorder-improving Provided smoking cessation counseling, she is currently cutting back.  Alcohol use disorder in remission We will continue to monitor closely  Follow-up in clinic in 3 to 4 weeks or sooner if needed.  February 3 at 4 PM  I have spent atleast 20 minutes non  face to face with patient today. More than 50 % of the time was spent for  ordering medications and test ,psychoeducation and supportive psychotherapy and care coordination,as well as documenting clinical information in electronic health record. This note was generated in part or whole with voice recognition software. Voice recognition is usually quite accurate but there are transcription errors that can and very often do occur. I apologize for any typographical errors that were not detected and corrected.        Ursula Alert, MD 03/12/2019, 4:53 PM

## 2019-04-03 ENCOUNTER — Encounter: Payer: Self-pay | Admitting: Psychiatry

## 2019-04-03 ENCOUNTER — Other Ambulatory Visit: Payer: Self-pay

## 2019-04-03 ENCOUNTER — Ambulatory Visit (INDEPENDENT_AMBULATORY_CARE_PROVIDER_SITE_OTHER): Payer: Medicaid Other | Admitting: Psychiatry

## 2019-04-03 DIAGNOSIS — F4310 Post-traumatic stress disorder, unspecified: Secondary | ICD-10-CM

## 2019-04-03 DIAGNOSIS — F3162 Bipolar disorder, current episode mixed, moderate: Secondary | ICD-10-CM

## 2019-04-03 DIAGNOSIS — F17200 Nicotine dependence, unspecified, uncomplicated: Secondary | ICD-10-CM | POA: Diagnosis not present

## 2019-04-03 DIAGNOSIS — Z8659 Personal history of other mental and behavioral disorders: Secondary | ICD-10-CM

## 2019-04-03 DIAGNOSIS — F1021 Alcohol dependence, in remission: Secondary | ICD-10-CM

## 2019-04-03 MED ORDER — HYDROXYZINE HCL 25 MG PO TABS: 25.0000 mg | ORAL_TABLET | Freq: Three times a day (TID) | ORAL | 2 refills | Status: DC | PRN
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
## 2019-05-01 ENCOUNTER — Other Ambulatory Visit: Payer: Self-pay

## 2019-05-01 ENCOUNTER — Encounter: Payer: Self-pay | Admitting: Psychiatry

## 2019-05-01 ENCOUNTER — Ambulatory Visit (INDEPENDENT_AMBULATORY_CARE_PROVIDER_SITE_OTHER): Payer: Medicaid Other | Admitting: Psychiatry

## 2019-05-01 ENCOUNTER — Other Ambulatory Visit: Payer: Self-pay | Admitting: Psychiatry

## 2019-05-01 DIAGNOSIS — Z8659 Personal history of other mental and behavioral disorders: Secondary | ICD-10-CM | POA: Diagnosis not present

## 2019-05-01 DIAGNOSIS — F17200 Nicotine dependence, unspecified, uncomplicated: Secondary | ICD-10-CM

## 2019-05-01 DIAGNOSIS — F1021 Alcohol dependence, in remission: Secondary | ICD-10-CM

## 2019-05-01 DIAGNOSIS — F3162 Bipolar disorder, current episode mixed, moderate: Secondary | ICD-10-CM

## 2019-05-01 DIAGNOSIS — F4310 Post-traumatic stress disorder, unspecified: Secondary | ICD-10-CM

## 2019-05-01 MED ORDER — ARIPIPRAZOLE 5 MG PO TABS: 5.0000 mg | ORAL_TABLET | Freq: Every day | ORAL | 1 refills | Status: DC

## 2019-05-01 MED ORDER — TRAZODONE HCL 100 MG PO TABS: 150.0000 mg | ORAL_TABLET | Freq: Every evening | ORAL | 1 refills | Status: DC | PRN

## 2019-05-01 MED ORDER — FLUOXETINE HCL 20 MG PO CAPS: 20.0000 mg | ORAL_CAPSULE | Freq: Every day | ORAL | 1 refills | Status: DC
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
## 2019-05-13 ENCOUNTER — Other Ambulatory Visit: Payer: Self-pay

## 2019-05-13 ENCOUNTER — Ambulatory Visit (INDEPENDENT_AMBULATORY_CARE_PROVIDER_SITE_OTHER): Payer: Medicaid Other | Admitting: Licensed Clinical Social Worker

## 2019-05-13 ENCOUNTER — Encounter: Payer: Self-pay | Admitting: Licensed Clinical Social Worker

## 2019-05-13 DIAGNOSIS — F3162 Bipolar disorder, current episode mixed, moderate: Secondary | ICD-10-CM

## 2019-05-13 DIAGNOSIS — F4310 Post-traumatic stress disorder, unspecified: Secondary | ICD-10-CM
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
## 2019-05-22 ENCOUNTER — Encounter: Payer: Self-pay | Admitting: Psychiatry

## 2019-05-22 ENCOUNTER — Other Ambulatory Visit: Payer: Self-pay | Admitting: Psychiatry

## 2019-05-22 ENCOUNTER — Ambulatory Visit (INDEPENDENT_AMBULATORY_CARE_PROVIDER_SITE_OTHER): Payer: Medicaid Other | Admitting: Psychiatry

## 2019-05-22 ENCOUNTER — Other Ambulatory Visit: Payer: Self-pay

## 2019-05-22 DIAGNOSIS — F17200 Nicotine dependence, unspecified, uncomplicated: Secondary | ICD-10-CM

## 2019-05-22 DIAGNOSIS — F3162 Bipolar disorder, current episode mixed, moderate: Secondary | ICD-10-CM | POA: Diagnosis not present

## 2019-05-22 DIAGNOSIS — F4310 Post-traumatic stress disorder, unspecified: Secondary | ICD-10-CM | POA: Diagnosis not present

## 2019-05-22 DIAGNOSIS — Z8659 Personal history of other mental and behavioral disorders: Secondary | ICD-10-CM

## 2019-05-22 DIAGNOSIS — F1021 Alcohol dependence, in remission: Secondary | ICD-10-CM

## 2019-05-22 MED ORDER — PRAZOSIN HCL 1 MG PO CAPS: 1.0000 mg | ORAL_CAPSULE | Freq: Every day | ORAL | 1 refills | Status: DC

## 2019-05-22 MED ORDER — FLUOXETINE HCL 40 MG PO CAPS: 40.0000 mg | ORAL_CAPSULE | Freq: Every day | ORAL | 1 refills | Status: DC
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
## 2019-05-29 ENCOUNTER — Ambulatory Visit: Payer: Medicaid Other | Admitting: Licensed Clinical Social Worker

## 2019-05-29 ENCOUNTER — Other Ambulatory Visit: Payer: Self-pay

## 2019-06-12 ENCOUNTER — Ambulatory Visit: Payer: Medicaid Other | Admitting: Psychiatry

## 2019-06-13 ENCOUNTER — Ambulatory Visit (INDEPENDENT_AMBULATORY_CARE_PROVIDER_SITE_OTHER): Payer: Medicaid Other | Admitting: Psychiatry

## 2019-06-13 ENCOUNTER — Other Ambulatory Visit: Payer: Self-pay

## 2019-06-13 ENCOUNTER — Ambulatory Visit: Payer: Medicaid Other | Admitting: Psychiatry

## 2019-06-13 ENCOUNTER — Encounter: Payer: Self-pay | Admitting: Psychiatry

## 2019-06-13 DIAGNOSIS — F3162 Bipolar disorder, current episode mixed, moderate: Secondary | ICD-10-CM | POA: Diagnosis not present

## 2019-06-13 DIAGNOSIS — F17200 Nicotine dependence, unspecified, uncomplicated: Secondary | ICD-10-CM | POA: Diagnosis not present

## 2019-06-13 DIAGNOSIS — F4310 Post-traumatic stress disorder, unspecified: Secondary | ICD-10-CM

## 2019-06-13 DIAGNOSIS — F1021 Alcohol dependence, in remission: Secondary | ICD-10-CM

## 2019-06-13 DIAGNOSIS — Z8659 Personal history of other mental and behavioral disorders: Secondary | ICD-10-CM

## 2019-06-13 MED ORDER — DOXEPIN HCL 10 MG PO CAPS: 10.0000 mg | ORAL_CAPSULE | Freq: Every evening | ORAL | 1 refills | Status: DC | PRN

## 2019-06-13 NOTE — Progress Notes (Signed)
Provider Location : [facility] Patient Location : Home  Virtual Visit via Video Note  I connected with [name] on 06/13/19 at  9:40 AM EDT by a video enabled telemedicine application and verified that I am speaking with the correct person using two identifiers.   I discussed the limitations of evaluation and management by telemedicine and the availability of in person appointments. The patient expressed understanding and agreed to proceed.   I discussed the assessment and treatment plan with the patient. The patient was provided an opportunity to ask questions and all were answered. The patient agreed with the plan and demonstrated an understanding of the instructions.   The patient was advised to call back or seek an in-person evaluation if the symptoms worsen or if the condition fails to improve as anticipated.   BH MD OP Progress Note  06/13/2019 5:45 PM Krissi Willaims  MRN:  [id number]  Chief Complaint:  Chief Complaint    Follow-up     HPI: Allison Nunez is a 34 year old Caucasian female, single, lives in [place], employed, has a history of bipolar disorder, ADHD, alcohol use disorder in remission, tobacco use disorder, PTSD was evaluated by telemedicine today.  Patient today reports she is currently struggling with sadness, crying spells, sleep issues and anxiety symptoms.  She reports she has been noncompliant with all her medications the past 2 weeks.  She also did not start the new medication prazosin which was prescribed for nightmares.  She reports this is only because she was having a lot of GI issues.  Her primary care provider has ordered some labs and she is being referred to a GI specialist.  She however reports in spite of stopping all her medications her GI symptoms did not get better.  She hence does not believe her GI issues are due to her medications.  She wants to get back on her Prozac and Abilify.  She reports sleep as poor.  She reports that trazodone does not seem to be  helpful.  Patient is interested in a new sleep medication.  Discussed doxepin.  She agrees with plan.  Patient does report fleeting suicidal thoughts that happened recently.  She however reports she was able to talk to her friend who is like a sister to her and that did help.  Patient currently denies any active thoughts or plans.  Patient denies any perceptual disturbances.  Patient reports her daughter who is being raised by her mother spent some time with her which may have triggered her recent depressive episode or may be contributed to it getting worse.  She reports  she had to drop her daughter back couple of days ago.  Patient became very tearful when she discussed this.   Visit Diagnosis:    ICD-10-CM   1. Bipolar 1 disorder, mixed, moderate (HCC)  F31.62   2. PTSD (post-traumatic stress disorder)  F43.10 doxepin (SINEQUAN) 10 MG capsule  3. History of ADHD  Z86.59   4. Tobacco use disorder  F17.200   5. Alcohol use disorder, moderate, in sustained remission (HCC)  F10.21     Past Psychiatric History: I have reviewed past psychiatric history from my progress note on 12/25/2018.  Past trials of Depakote-hair loss, Seroquel, Prozac, Wellbutrin, risperidone, Adderall, Ritalin, Geodon  Past Medical History:  Past Medical History:  Diagnosis Date  . ADHD (attention deficit hyperactivity disorder)   . Anxiety   . BV (bacterial vaginosis)   . Cervical dysplasia   . Depression   . Hypothyroidism  Past Surgical History:  Procedure Laterality Date  . TUBAL LIGATION      Family Psychiatric History: I have reviewed family psychiatric history from my progress note on 12/25/2018  Family History:  Family History  Problem Relation Age of Onset  . Anxiety disorder Sister   . Depression Sister   . Alcohol abuse Brother   . Drug abuse Brother     Social History: Reviewed social history from my progress note on 12/25/2018 Social History   Socioeconomic History  . Marital  status: Single    Spouse name: Not on file  . Number of children: 5  . Years of education: Not on file  . Highest education level: High school graduate  Occupational History  . Not on file  Tobacco Use  . Smoking status: Current Every Day Smoker    Packs/day: 1.00    Years: 16.00    Pack years: 16.00    Types: Cigarettes  . Smokeless tobacco: Never Used  . Tobacco comment: 3 cigarettes a day  Substance and Sexual Activity  . Alcohol use: Not Currently  . Drug use: Never  . Sexual activity: Yes    Birth control/protection: Condom  Other Topics Concern  . Not on file  Social History Narrative  . Not on file   Social Determinants of Health   Financial Resource Strain: Medium Risk  . Difficulty of Paying Living Expenses: Somewhat hard  Food Insecurity: Food Insecurity Present  . Worried About Programme researcher, broadcasting/film/video in the Last Year: Sometimes true  . Ran Out of Food in the Last Year: Sometimes true  Transportation Needs: No Transportation Needs  . Lack of Transportation (Medical): No  . Lack of Transportation (Non-Medical): No  Physical Activity: Inactive  . Days of Exercise per Week: 0 days  . Minutes of Exercise per Session: 0 min  Stress: Stress Concern Present  . Feeling of Stress : Rather much  Social Connections: Unknown  . Frequency of Communication with Friends and Family: Not on file  . Frequency of Social Gatherings with Friends and Family: Not on file  . Attends Religious Services: Never  . Active Member of Clubs or Organizations: No  . Attends Banker Meetings: Never  . Marital Status: Never married    Allergies: No Known Allergies  Metabolic Disorder Labs: No results found for: HGBA1C, MPG No results found for: PROLACTIN No results found for: CHOL, TRIG, HDL, CHOLHDL, VLDL, LDLCALC No results found for: TSH  Therapeutic Level Labs: No results found for: LITHIUM No results found for: VALPROATE No components found for:  CBMZ  Current  Medications: Current Outpatient Medications  Medication Sig Dispense Refill  . ARIPiprazole (ABILIFY) 5 MG tablet TAKE 1 TABLET(5 MG) BY MOUTH DAILY 90 tablet 0  . doxepin (SINEQUAN) 10 MG capsule Take 1-2 capsules (10-20 mg total) by mouth at bedtime as needed. SLEEP 60 capsule 1  . FLUoxetine (PROZAC) 40 MG capsule Take 1 capsule (40 mg total) by mouth daily. 30 capsule 1  . hydrOXYzine (ATARAX/VISTARIL) 25 MG tablet Take 1 tablet (25 mg total) by mouth 3 (three) times daily as needed for anxiety. 90 tablet 2  . prazosin (MINIPRESS) 1 MG capsule TAKE 1 CAPSULE(1 MG) BY MOUTH AT BEDTIME FOR NIGHTMARES 90 capsule 0   No current facility-administered medications for this visit.     Musculoskeletal: Strength & Muscle Tone: UTA Gait & Station: normal Patient leans: N/A  Psychiatric Specialty Exam: Review of Systems  Gastrointestinal: Positive for diarrhea.  Psychiatric/Behavioral: Positive for dysphoric mood and sleep disturbance. The patient is nervous/anxious.   All other systems reviewed and are negative.   There were no vitals taken for this visit.There is no height or weight on file to calculate BMI.  General Appearance: Casual  Eye Contact:  Fair  Speech:  Clear and Coherent  Volume:  Normal  Mood:  Anxious and Depressed  Affect:  Congruent  Thought Process:  Goal Directed and Descriptions of Associations: Intact  Orientation:  Full (Time, Place, and Person)  Thought Content: Logical   Suicidal Thoughts:  No  Homicidal Thoughts:  No  Memory:  Immediate;   Fair Recent;   Fair Remote;   Fair  Judgement:  Fair  Insight:  Fair  Psychomotor Activity:  Normal  Concentration:  Concentration: Fair and Attention Span: Fair  Recall:  [employer] of Knowledge: Fair  Language: Fair  Akathisia:  No  Handed:  Right  AIMS (if indicated): UTA  Assets:  Communication Skills Desire for Improvement Housing Social Support  ADL's:  Intact  Cognition: WNL  Sleep:  Poor    Screenings: GAD-7     [facility] Visit from 04/24/2018 in [facility]  Total GAD-7 Score  18    PHQ2-9     Office Visit from 04/24/2018 in [facility] Office Visit from 01/09/2018 in [facility] for Infectious Disease  PHQ-2 Total Score  4  0  PHQ-9 Total Score  12  --       Assessment and Plan: Carlo is a 34 year old Caucasian female, employed, single, lives in [place], has a history of bipolar disorder, ADHD, history of trauma was evaluated by telemedicine today.  Patient is biologically predisposed given her family history as well as history of trauma.  Patient also with psychosocial stressors of her own health issues, relationship struggles, being a single mother, recent death in the family, job related stressors.  Patient continues to struggle with her own health issues, as well as mood symptoms.  Patient with recent suicidality however currently denies any active thoughts or plan and agrees to reach out to her social support system.  Patient is motivated to stay on medications, encouraged compliance.  Plan as noted below.  Plan Bipolar disorder-unstable Abilify 5 mg p.o. daily Start doxepin 10 to 20 mg p.o. nightly as needed for sleep  PTSD-unstable Continue Prozac at 40 mg p.o. daily Patient has not started prazosin yet-she was prescribed 1 mg p.o. nightly for nightmares.  This is because of recent GI issues.  She is scheduled to see a gastro enterologist soon. Continue CBT-patient advised to establish care with a therapist since her therapist is not available for the next few months.  .  Tobacco use disorder-improving Provided counseling  Call use disorder in remission Will monitor closely.  History of ADHD-chronic-he is not on medications at this time.  Will not make a lot of medication readjustment today since she currently has GI problems and is scheduled to see a gastroenterologist.  Crisis plan  discussed with patient.  Follow-up in clinic in 3 to 4 weeks or sooner if needed.  I have spent atleast 20 minutes non face to face with patient today. More than 50 % of the time was spent for preparing to see the patient ( e.g., review of test, records ),  ordering medications and test ,psychoeducation and supportive psychotherapy and care coordination,as well as documenting clinical information in electronic health record. This note was  generated in part or whole with voice recognition software. Voice recognition is usually quite accurate but there are transcription errors that can and very often do occur. I apologize for any typographical errors that were not detected and corrected.      Ursula Alert, MD 06/13/2019, 5:45 PM

## 2019-06-25 ENCOUNTER — Other Ambulatory Visit: Payer: Self-pay | Admitting: Psychiatry

## 2019-06-25 DIAGNOSIS — F3162 Bipolar disorder, current episode mixed, moderate: Secondary | ICD-10-CM

## 2019-07-09 ENCOUNTER — Encounter: Payer: Self-pay | Admitting: Psychiatry

## 2019-07-09 ENCOUNTER — Telehealth (INDEPENDENT_AMBULATORY_CARE_PROVIDER_SITE_OTHER): Payer: Medicaid Other | Admitting: Psychiatry

## 2019-07-09 ENCOUNTER — Other Ambulatory Visit: Payer: Self-pay

## 2019-07-09 DIAGNOSIS — F1021 Alcohol dependence, in remission: Secondary | ICD-10-CM

## 2019-07-09 DIAGNOSIS — F4310 Post-traumatic stress disorder, unspecified: Secondary | ICD-10-CM

## 2019-07-09 DIAGNOSIS — F3162 Bipolar disorder, current episode mixed, moderate: Secondary | ICD-10-CM | POA: Diagnosis not present

## 2019-07-09 DIAGNOSIS — F17200 Nicotine dependence, unspecified, uncomplicated: Secondary | ICD-10-CM

## 2019-07-09 DIAGNOSIS — Z8659 Personal history of other mental and behavioral disorders: Secondary | ICD-10-CM

## 2019-07-09 DIAGNOSIS — F17210 Nicotine dependence, cigarettes, uncomplicated: Secondary | ICD-10-CM | POA: Diagnosis not present

## 2019-07-09 MED ORDER — ARIPIPRAZOLE 10 MG PO TABS: 10.0000 mg | ORAL_TABLET | Freq: Every day | ORAL | 1 refills | Status: DC

## 2019-07-09 MED ORDER — FLUOXETINE HCL 40 MG PO CAPS: 40.0000 mg | ORAL_CAPSULE | Freq: Every day | ORAL | 1 refills | Status: DC

## 2019-07-09 NOTE — Progress Notes (Signed)
Provider Location : [facility] Patient Location : Friend's home  Virtual Visit via Video Note  I connected with [name] on 07/09/19 at  1:20 PM EDT by a video enabled telemedicine application and verified that I am speaking with the correct person using two identifiers.   I discussed the limitations of evaluation and management by telemedicine and the availability of in person appointments. The patient expressed understanding and agreed to proceed.     I discussed the assessment and treatment plan with the patient. The patient was provided an opportunity to ask questions and all were answered. The patient agreed with the plan and demonstrated an understanding of the instructions.   The patient was advised to call back or seek an in-person evaluation if the symptoms worsen or if the condition fails to improve as anticipated.   BH MD OP Progress Note  07/09/2019 1:58 PM [name]  MRN:  [id number]  Chief Complaint:  Chief Complaint    Follow-up     HPI: Allison Nunez is a 34 year old Caucasian female, single, lives in [place], employed however currently on leave of absence, has a history of bipolar disorder, PTSD, ADHD, tobacco use disorder, alcohol use disorder in remission, was evaluated by telemedicine today.  Patient reports she went through an episode of severe depression 2 weeks ago.  She reports she had fleeting suicidal thoughts at that time.  Patient however reports her friend was able to come and help around.  She currently lives with her friend who lives 15 minutes away from where she lives.  Patient reports since being with her friend she has been feeling much better.  Patient reports she may also have gone through an episode of mania at that time.  She reports she went and got a few tattoos, cut off her hair and also spent a lot of money.  Patient continues to struggle with sadness however she reports it is improving.  She also has a lot of racing thoughts.  Since being with  her friend that has improved.  Patient reports sleep is better on the doxepin.  She however does wake up at night however is able to go back to sleep.  Patient reports her nightmares have improved.  Patient denies suicidality at this time, denies homicidality or perceptual disturbances.  She continues to struggle with GI issues, has diarrhea.  She has an appointment with a GI specialist tomorrow.  She looks forward to that.  Patient denies any other concerns today.  Visit Diagnosis:    ICD-10-CM   1. Bipolar 1 disorder, mixed, moderate (HCC)  F31.62 ARIPiprazole (ABILIFY) 10 MG tablet    FLUoxetine (PROZAC) 40 MG capsule  2. PTSD (post-traumatic stress disorder)  F43.10 FLUoxetine (PROZAC) 40 MG capsule  3. History of ADHD  Z86.59   4. Tobacco use disorder  F17.200   5. Alcohol use disorder, moderate, in sustained remission (HCC)  F10.21     Past Psychiatric History: I have reviewed past psychiatric history from my progress note on 12/25/2018.  Past trials of Depakote-hair loss, Seroquel, Prozac, Wellbutrin, risperidone, Adderall, Ritalin, Geodon  Past Medical History:  Past Medical History:  Diagnosis Date  . ADHD (attention deficit hyperactivity disorder)   . Anxiety   . BV (bacterial vaginosis)   . Cervical dysplasia   . Depression   . Hypothyroidism     Past Surgical History:  Procedure Laterality Date  . TUBAL LIGATION      Family Psychiatric History: I have reviewed family psychiatric history  from my progress note on 12/25/2018  Family History:  Family History  Problem Relation Age of Onset  . Anxiety disorder Sister   . Depression Sister   . Alcohol abuse Brother   . Drug abuse Brother     Social History: I have reviewed social history from my progress note on 12/25/2018 Social History   Socioeconomic History  . Marital status: Single    Spouse name: Not on file  . Number of children: 5  . Years of education: Not on file  . Highest education level: High  school graduate  Occupational History  . Not on file  Tobacco Use  . Smoking status: Current Every Day Smoker    Packs/day: 1.00    Years: 16.00    Pack years: 16.00    Types: Cigarettes  . Smokeless tobacco: Never Used  . Tobacco comment: 13 Cigarettes per day  Substance and Sexual Activity  . Alcohol use: Not Currently  . Drug use: Never  . Sexual activity: Yes    Birth control/protection: Condom  Other Topics Concern  . Not on file  Social History Narrative  . Not on file   Social Determinants of Health   Financial Resource Strain: Medium Risk  . Difficulty of Paying Living Expenses: Somewhat hard  Food Insecurity: Food Insecurity Present  . Worried About Charity fundraiser in the Last Year: Sometimes true  . Ran Out of Food in the Last Year: Sometimes true  Transportation Needs: No Transportation Needs  . Lack of Transportation (Medical): No  . Lack of Transportation (Non-Medical): No  Physical Activity: Inactive  . Days of Exercise per Week: 0 days  . Minutes of Exercise per Session: 0 min  Stress: Stress Concern Present  . Feeling of Stress : Rather much  Social Connections: Unknown  . Frequency of Communication with Friends and Family: Not on file  . Frequency of Social Gatherings with Friends and Family: Not on file  . Attends Religious Services: Never  . Active Member of Clubs or Organizations: No  . Attends Archivist Meetings: Never  . Marital Status: Never married    Allergies: No Known Allergies  Metabolic Disorder Labs: No results found for: HGBA1C, MPG No results found for: PROLACTIN No results found for: CHOL, TRIG, HDL, CHOLHDL, VLDL, LDLCALC No results found for: TSH  Therapeutic Level Labs: No results found for: LITHIUM No results found for: VALPROATE No components found for:  CBMZ  Current Medications: Current Outpatient Medications  Medication Sig Dispense Refill  . ARIPiprazole (ABILIFY) 10 MG tablet Take 1 tablet (10 mg  total) by mouth daily. 30 tablet 1  . doxepin (SINEQUAN) 10 MG capsule Take 1-2 capsules (10-20 mg total) by mouth at bedtime as needed. SLEEP 60 capsule 1  . FLUoxetine (PROZAC) 40 MG capsule Take 1 capsule (40 mg total) by mouth daily. 30 capsule 1  . hydrOXYzine (ATARAX/VISTARIL) 25 MG tablet Take 1 tablet (25 mg total) by mouth 3 (three) times daily as needed for anxiety. 90 tablet 2  . prazosin (MINIPRESS) 1 MG capsule TAKE 1 CAPSULE(1 MG) BY MOUTH AT BEDTIME FOR NIGHTMARES 90 capsule 0   No current facility-administered medications for this visit.     Musculoskeletal: Strength & Muscle Tone: UTA Gait & Station: normal Patient leans: N/A  Psychiatric Specialty Exam: Review of Systems  Gastrointestinal: Positive for diarrhea.  Psychiatric/Behavioral: Positive for dysphoric mood and sleep disturbance.  All other systems reviewed and are negative.   There were  no vitals taken for this visit.There is no height or weight on file to calculate BMI.  General Appearance: Casual  Eye Contact:  Fair  Speech:  Clear and Coherent  Volume:  Normal  Mood:  Depressed  Affect:  Congruent  Thought Process:  Goal Directed and Descriptions of Associations: Intact  Orientation:  Full (Time, Place, and Person)  Thought Content: Logical   Suicidal Thoughts:  No  Homicidal Thoughts:  No  Memory:  Immediate;   Fair Recent;   Fair Remote;   Fair  Judgement:  Fair  Insight:  Fair  Psychomotor Activity:  Normal  Concentration:  Concentration: Fair and Attention Span: Fair  Recall:  [employer] of Knowledge: Fair  Language: Fair  Akathisia:  No  Handed:  Right  AIMS (if indicated): UTA  Assets:  Communication Skills Desire for Improvement Housing Social Support  ADL's:  Intact  Cognition: WNL  Sleep:  Improving   Screenings: GAD-7     Office Visit from 04/24/2018 in [facility]  Total GAD-7 Score  18    PHQ2-9     Office Visit from 04/24/2018 in Center  for [facility] Office Visit from 01/09/2018 in [facility] for Infectious Disease  PHQ-2 Total Score  4  0  PHQ-9 Total Score  12  --       Assessment and Plan: Najmo Pardue is a 34 year old Caucasian female, employed, single, lives in [place], has a history of bipolar disorder, ADHD, history of trauma was evaluated by telemedicine today.  She is biologically predisposed given her family history as well as history of trauma.  Patient with psychosocial stressors of her recent health issues, relationship struggles, being a single mother, job related stressors.  Patient however is currently on a leave of absence and has good social support system from her friend with whom she lives.  Patient will continue to benefit from medication readjustment however will also will continue to benefit from following up with her GI specialist due to her current GI problems.  Plan as noted below.  Plan Bipolar disorder mixed-unstable Increase Abilify to 10 mg p.o. daily Doxepin 10 to 20 mg p.o. nightly as needed for sleep  PTSD-some progress Prozac 40 mg p.o. daily Prazosin 1 mg p.o. nightly Patient advised to establish care with a new therapist.  Tobacco use disorder-improving Patient reports she had started smoking again however currently is cutting back.  Provided counseling  Alcohol use disorder in remission She did relapse a few weeks ago.  She however has been sober since then.  History of ADHD-chronic-she is currently not on medications.  Patient advised to sign a release to obtain medical records from her GI specialist.  Discussed with patient about Abilify Maintena LAI since she does have diarrhea which could be affecting her p.o. medications bioavailability.  Crisis plan discussed with patient.  Patient advised to go to the nearest emergency department if she is suicidal or has worsening symptoms.  Follow-up in clinic in 2 weeks or sooner if needed.  I have  spent atleast 20 minutes non face to face with patient today. More than 50 % of the time was spent for preparing to see the patient ( e.g., review of test, records ), obtaining and to review and separately obtained history , ordering medications and test ,psychoeducation and supportive psychotherapy and care coordination,as well as documenting clinical information in electronic health record. This note was generated in part or whole with voice  recognition software. Voice recognition is usually quite accurate but there are transcription errors that can and very often do occur. I apologize for any typographical errors that were not detected and corrected.       Jomarie Longs, MD 07/09/2019, 1:58 PM

## 2019-07-24 ENCOUNTER — Other Ambulatory Visit: Payer: Self-pay

## 2019-07-24 ENCOUNTER — Telehealth (INDEPENDENT_AMBULATORY_CARE_PROVIDER_SITE_OTHER): Payer: Medicaid Other | Admitting: Psychiatry

## 2019-07-24 ENCOUNTER — Encounter: Payer: Self-pay | Admitting: Psychiatry

## 2019-07-24 DIAGNOSIS — F4310 Post-traumatic stress disorder, unspecified: Secondary | ICD-10-CM

## 2019-07-24 DIAGNOSIS — Z8659 Personal history of other mental and behavioral disorders: Secondary | ICD-10-CM | POA: Diagnosis not present

## 2019-07-24 DIAGNOSIS — F3162 Bipolar disorder, current episode mixed, moderate: Secondary | ICD-10-CM

## 2019-07-24 DIAGNOSIS — F17200 Nicotine dependence, unspecified, uncomplicated: Secondary | ICD-10-CM | POA: Diagnosis not present

## 2019-07-24 DIAGNOSIS — F1021 Alcohol dependence, in remission: Secondary | ICD-10-CM

## 2019-07-24 MED ORDER — PRAZOSIN HCL 1 MG PO CAPS: ORAL_CAPSULE | ORAL | 0 refills | Status: DC

## 2019-07-24 NOTE — Progress Notes (Signed)
Provider Location : [facility] Patient Location : Home  Virtual Visit via Video Note  I connected with Allison Nunez on 07/24/19 at  4:45 PM EDT by a video enabled telemedicine application and verified that I am speaking with the correct person using two identifiers.   I discussed the limitations of evaluation and management by telemedicine and the availability of in person appointments. The patient expressed understanding and agreed to proceed.   I discussed the assessment and treatment plan with the patient. The patient was provided an opportunity to ask questions and all were answered. The patient agreed with the plan and demonstrated an understanding of the instructions.   The patient was advised to call back or seek an in-person evaluation if the symptoms worsen or if the condition fails to improve as anticipated.   [facility] MD OP Progress Note  07/24/2019 9:53 AM Allison Nunez  MRN:  [id number]  Chief Complaint:  Chief Complaint    Follow-up     HPI: Allison Nunez is a 34 year old Caucasian female, single, lives in [place], employed, has a history of bipolar disorder, PTSD, ADHD, tobacco use disorder, alcohol use disorder in remission was evaluated by telemedicine today.  Patient today reports she is currently making progress with regards to her mood symptoms.  Her sadness and other depressive symptoms have improved.  She currently denies any suicidality.  She reports since she was able to take a break with her friend and spent some time off from work she was able to get herself together.  She is compliant on the medications and believes the higher dosage of the Abilify has been beneficial.  She denies side effects.  She reports sleep as fair.  She does have nights when she tosses and turns because she is in her friend;s home and its a change of environment for her, however overall she has been sleeping okay.  She denies any nightmares.  She is compliant on the prazosin.  She reports she went  back to work today briefly however will go back to work full-time till Monday.  She is ready for it.  Patient reports she had her gastroenterology appointment and was told that she may have IBS.  She does have upcoming appointment with them for a follow-up and has had several work-ups done.  She reports her diarrhea has improved the past few days.  She currently does not take dicyclomine.  She was told she could use it as needed.  Patient reports she is also planning to take a break with her [place] at the beach.  She looks forward to that.    Visit Diagnosis:    ICD-10-CM   1. Bipolar 1 disorder, mixed, moderate (HCC)  F31.62    improving  2. PTSD (post-traumatic stress disorder)  F43.10 prazosin (MINIPRESS) 1 MG capsule  3. History of ADHD  Z86.59   4. Tobacco use disorder  F17.200   5. Alcohol use disorder, moderate, in sustained remission (HCC)  F10.21     Past Psychiatric History: I have reviewed past psychiatric history from my progress note on 12/25/2018.  Past trials of Depakote-hair loss, Seroquel, Prozac, Wellbutrin, risperidone, Adderall, Ritalin, Geodon  Past Medical History:  Past Medical History:  Diagnosis Date  . ADHD (attention deficit hyperactivity disorder)   . Anxiety   . BV (bacterial vaginosis)   . Cervical dysplasia   . Depression   . Hypothyroidism     Past Surgical History:  Procedure Laterality Date  . TUBAL LIGATION  Family Psychiatric History: I have reviewed family psychiatric history from my progress note on 12/25/2018  Family History:  Family History  Problem Relation Age of Onset  . Anxiety disorder Sister   . Depression Sister   . Alcohol abuse Brother   . Drug abuse Brother     Social History: I have reviewed social history from my progress note on 12/25/2018 Social History   Socioeconomic History  . Marital status: Single    Spouse name: Not on file  . Number of children: 5  . Years of education: Not on file  . Highest  education level: High school graduate  Occupational History  . Not on file  Tobacco Use  . Smoking status: Current Every Day Smoker    Packs/day: 1.00    Years: 16.00    Pack years: 16.00    Types: Cigarettes  . Smokeless tobacco: Never Used  . Tobacco comment: 13 Cigarettes per day  Substance and Sexual Activity  . Alcohol use: Not Currently  . Drug use: Never  . Sexual activity: Yes    Birth control/protection: Condom  Other Topics Concern  . Not on file  Social History Narrative  . Not on file   Social Determinants of Health   Financial Resource Strain: Medium Risk  . Difficulty of Paying Living Expenses: Somewhat hard  Food Insecurity: Food Insecurity Present  . Worried About Programme researcher, broadcasting/film/video in the Last Year: Sometimes true  . Ran Out of Food in the Last Year: Sometimes true  Transportation Needs: No Transportation Needs  . Lack of Transportation (Medical): No  . Lack of Transportation (Non-Medical): No  Physical Activity: Inactive  . Days of Exercise per Week: 0 days  . Minutes of Exercise per Session: 0 min  Stress: Stress Concern Present  . Feeling of Stress : Rather much  Social Connections: Unknown  . Frequency of Communication with Friends and Family: Not on file  . Frequency of Social Gatherings with Friends and Family: Not on file  . Attends Religious Services: Never  . Active Member of Clubs or Organizations: No  . Attends Banker Meetings: Never  . Marital Status: Never married    Allergies: No Known Allergies  Metabolic Disorder Labs: No results found for: HGBA1C, MPG No results found for: PROLACTIN No results found for: CHOL, TRIG, HDL, CHOLHDL, VLDL, LDLCALC No results found for: TSH  Therapeutic Level Labs: No results found for: LITHIUM No results found for: VALPROATE No components found for:  CBMZ  Current Medications: Current Outpatient Medications  Medication Sig Dispense Refill  . ARIPiprazole (ABILIFY) 10 MG tablet  Take 1 tablet (10 mg total) by mouth daily. 30 tablet 1  . doxepin (SINEQUAN) 10 MG capsule Take 1-2 capsules (10-20 mg total) by mouth at bedtime as needed. SLEEP 60 capsule 1  . FLUoxetine (PROZAC) 40 MG capsule Take 1 capsule (40 mg total) by mouth daily. 30 capsule 1  . hydrOXYzine (ATARAX/VISTARIL) 25 MG tablet Take 1 tablet (25 mg total) by mouth 3 (three) times daily as needed for anxiety. 90 tablet 2  . prazosin (MINIPRESS) 1 MG capsule TAKE 1 CAPSULE(1 MG) BY MOUTH AT BEDTIME FOR NIGHTMARES 90 capsule 0   No current facility-administered medications for this visit.     Musculoskeletal: Strength & Muscle Tone: UTA Gait & Station: normal Patient leans: N/A  Psychiatric Specialty Exam: Review of Systems  Psychiatric/Behavioral: Positive for dysphoric mood (Improving) and sleep disturbance (improving).  All other systems reviewed and  are negative.   There were no vitals taken for this visit.There is no height or weight on file to calculate BMI.  General Appearance: Casual  Eye Contact:  Fair  Speech:  Normal Rate  Volume:  Normal  Mood:  Depressed improving  Affect:  Congruent  Thought Process:  Goal Directed and Descriptions of Associations: Intact  Orientation:  Full (Time, Place, and Person)  Thought Content: Logical   Suicidal Thoughts:  No  Homicidal Thoughts:  No  Memory:  Immediate;   Fair Recent;   Fair Remote;   Fair  Judgement:  Fair  Insight:  Fair  Psychomotor Activity:  Normal  Concentration:  Concentration: Fair and Attention Span: Fair  Recall:  [employer] of Knowledge: Fair  Language: Fair  Akathisia:  No  Handed:  Right  AIMS (if indicated): UTA  Assets:  Communication Skills Desire for Improvement Housing Social Support  ADL's:  Intact  Cognition: WNL  Sleep:  improving   Screenings: GAD-7     Office Visit from 04/24/2018 in [place] for [facility]  Total GAD-7 Score  18    PHQ2-9     Office Visit from 04/24/2018 in  [place] for [facility] Office Visit from 01/09/2018 in [facility] for Infectious Disease  PHQ-2 Total Score  4  0  PHQ-9 Total Score  12  --       Assessment and Plan: Allison Nunez is a 34 year old Caucasian female, employed, single, lives in [place], has a history of bipolar disorder, ADHD, history of trauma was evaluated by telemedicine today.  Patient is biologically predisposed given her family history as well as history of trauma as well as her medical problems.  She has psychosocial stressors of recent health issues, relationship struggles, being a single mother, job related stressors.  Patient is currently making progress with regards to her mood symptoms.  Patient will continue to follow-up with gastroenterology.  Plan as noted below.  Plan Bipolar disorder mixed-improving Abilify 10 mg p.o. daily Doxepin 10 to 20 mg p.o. nightly as needed for sleep  PTSD-improving  Prozac 40 mg daily Prazosin 1 mg p.o. nightly Patient was advised to establish care with new therapist.  Tobacco use disorder-improving Provided counseling.  Alcohol use disorder in remission We will continue to monitor  History of ADHD-chronic-she is currently not on medications  Have reviewed medical records received from GI specialist-dated 07/09/2019-per Dr. Nehemiah Settle' start dicyclomine 1 tablet 3 times a day before meals for abdominal pain.  Order stool ova and parasite, stool qualitative fat, stool culture and sensitivity, C. difficile a toxin, stool antigen for Cryptosporidium and Giardia, Cyclospora smear, stool, Hemosure-start Lomotil 1 tablet before every meal and at bedtime to control diarrhea.'   Follow-up in clinic in 3 to 4 weeks or sooner if needed.  I have spent atleast 20 minutes non face to face with patient today. More than 50 % of the time was spent for preparing to see the patient ( e.g., review of test, records ), ordering medications and test  ,psychoeducation and supportive psychotherapy and care coordination,as well as documenting clinical information in electronic health record. This note was generated in part or whole with voice recognition software. Voice recognition is usually quite accurate but there are transcription errors that can and very often do occur. I apologize for any typographical errors that were not detected and corrected.      Ursula Alert, MD 07/24/2019, 9:53 AM

## 2019-08-14 IMAGING — US TRANSVAGINAL ULTRASOUND OF PELVIS
2 series · 15 of 25 positions shown · non-contrast
Comparison: 05/04/2018

CLINICAL DATA: Follow-up RIGHT ovarian cyst, dysfunctional uterine
bleeding

EXAM:
ULTRASOUND PELVIS TRANSVAGINAL
TECHNIQUE: Transvaginal ultrasound examination of the pelvis was performed
including evaluation of the uterus, ovaries, adnexal regions, and
pelvic cul-de-sac.

[Series 1: transvaginal ultrasound of pelvis · 37 acquisitions, 14 frames shown (1 of 2)]
[im 1/37]
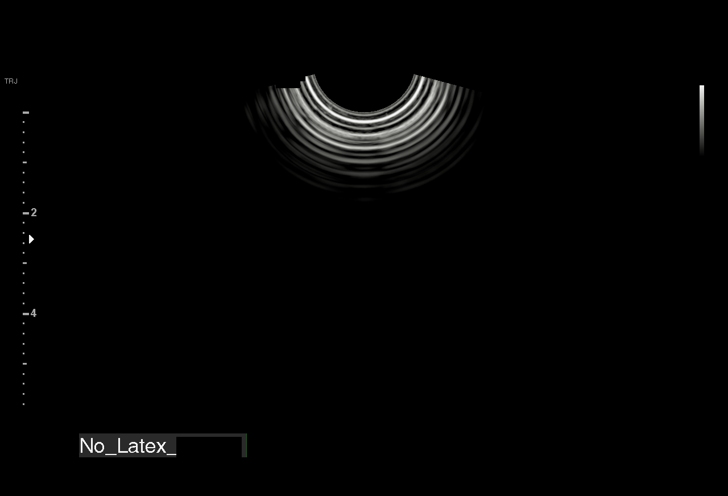
[im 4/37]
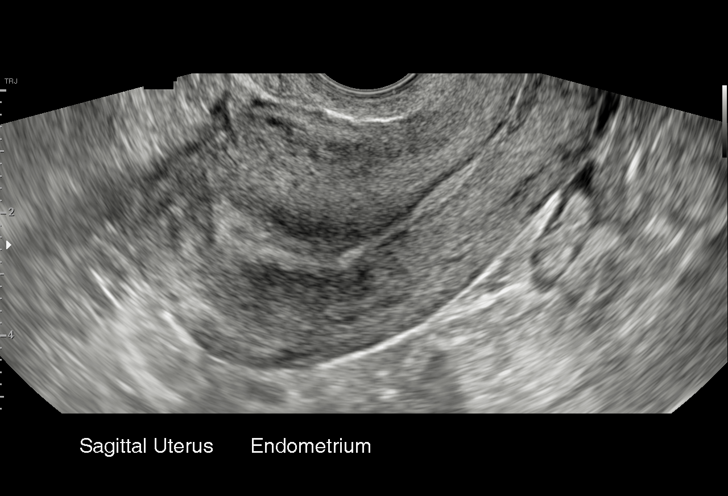
[im 7/37]
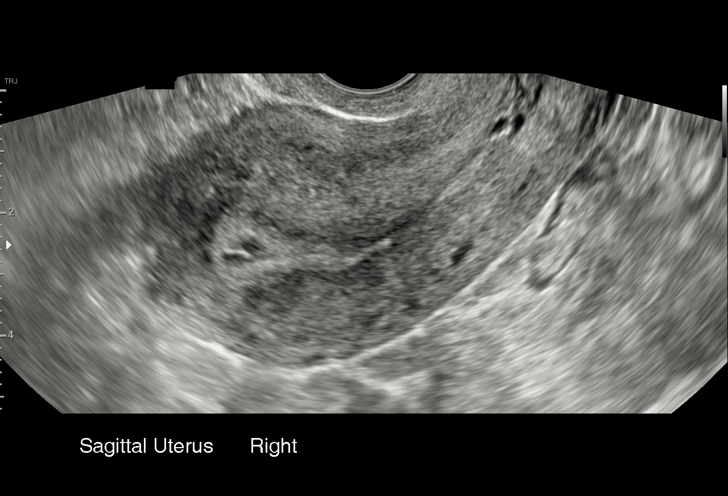
[im 8/37]
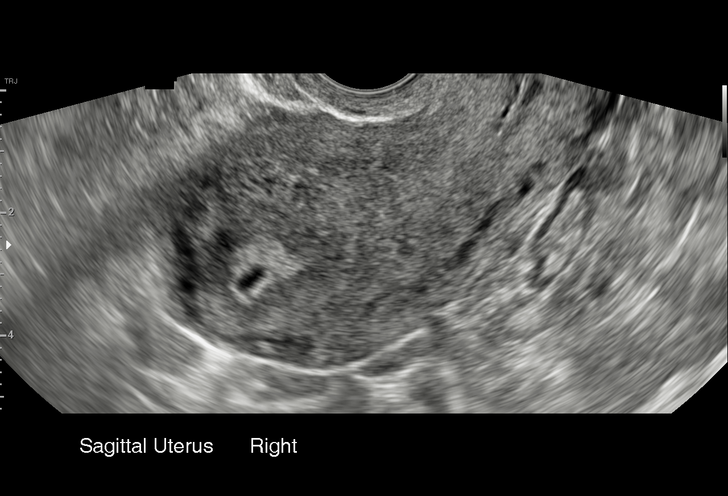
[im 11/37]
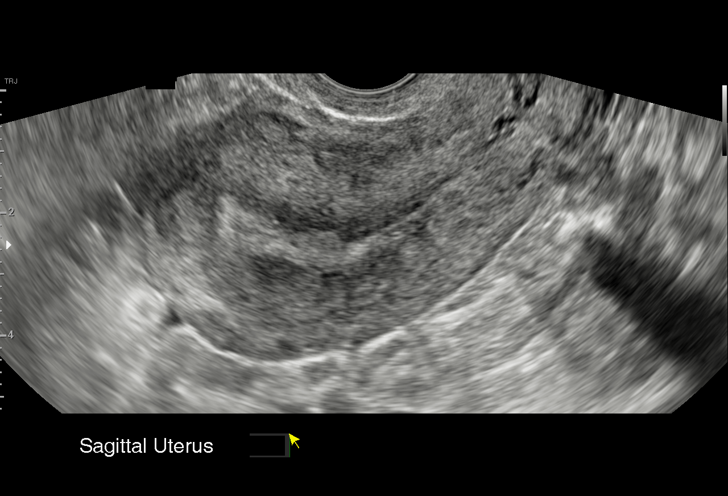
[im 15/37]
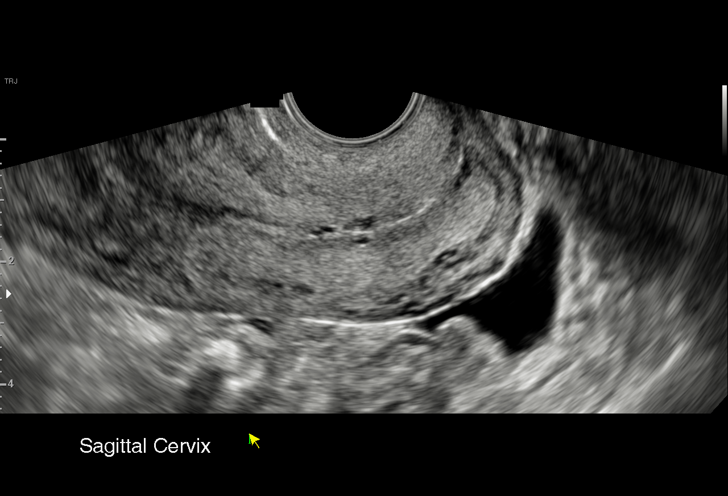
[im 16/37]
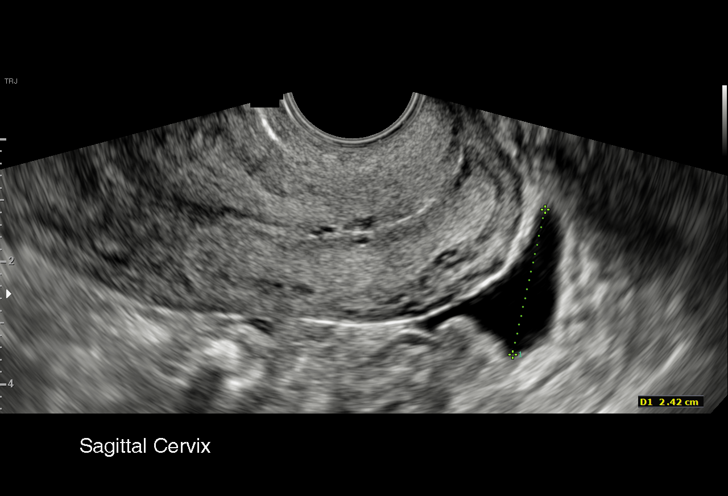
[im 19/37]
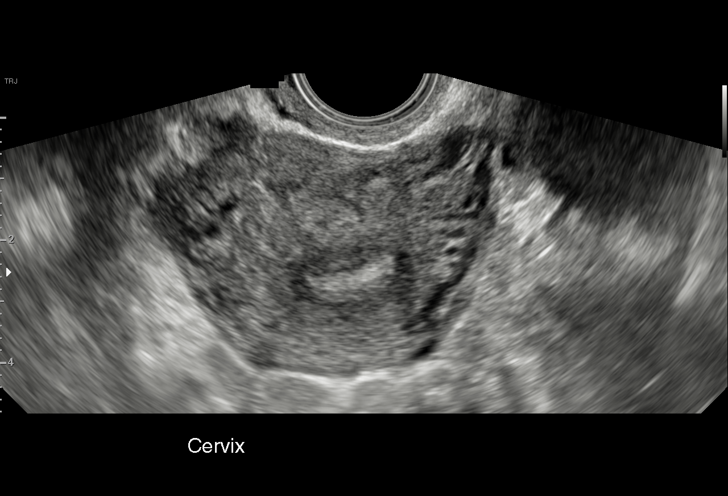
[im 22/37]
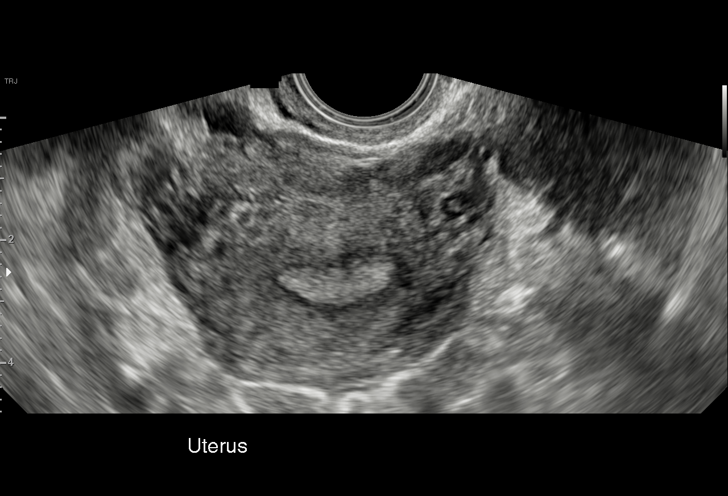
[im 24/37]
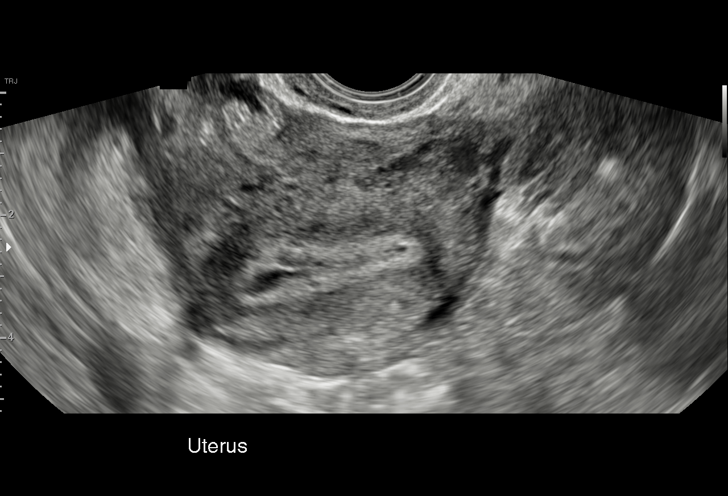
[im 27/37]
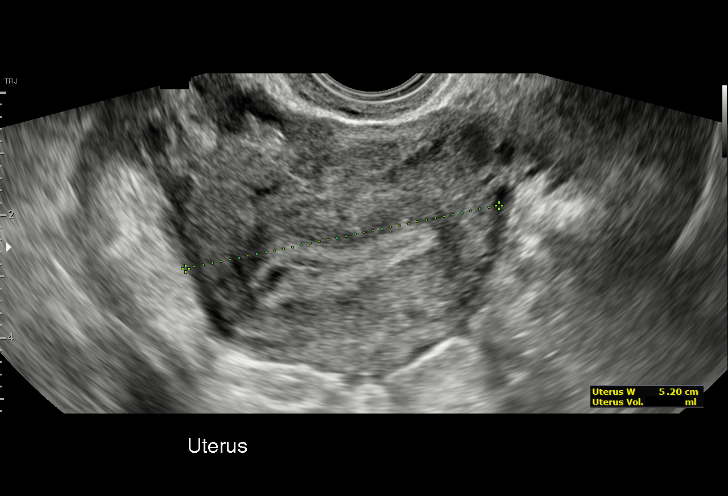
[im 30/37]
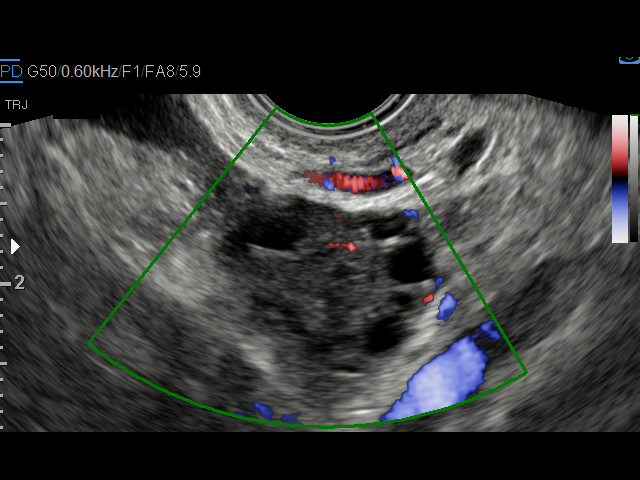
[im 32/37]
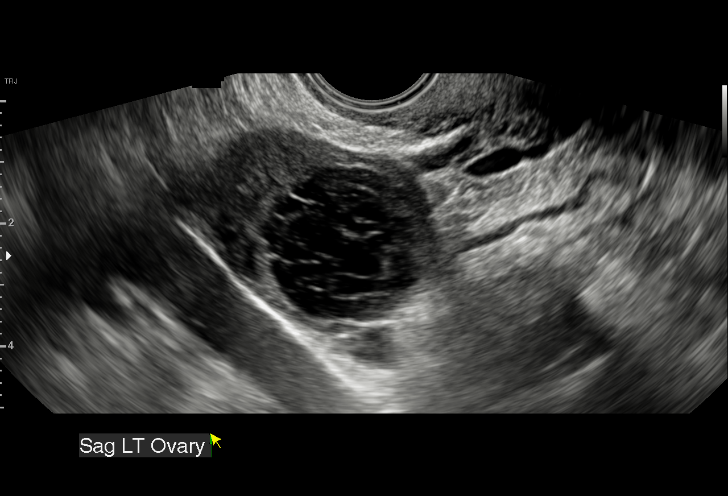
[im 35/37]
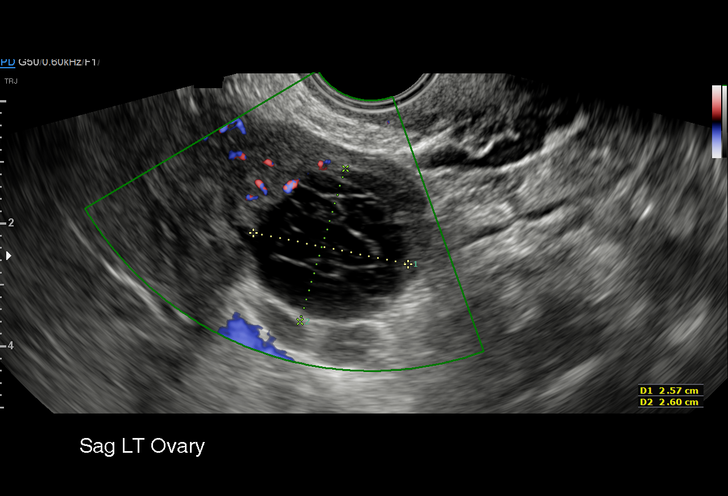

[Series 2: transvaginal ultrasound of pelvis · 1 of 1 slices shown (2 of 2)]
[im 1/1]
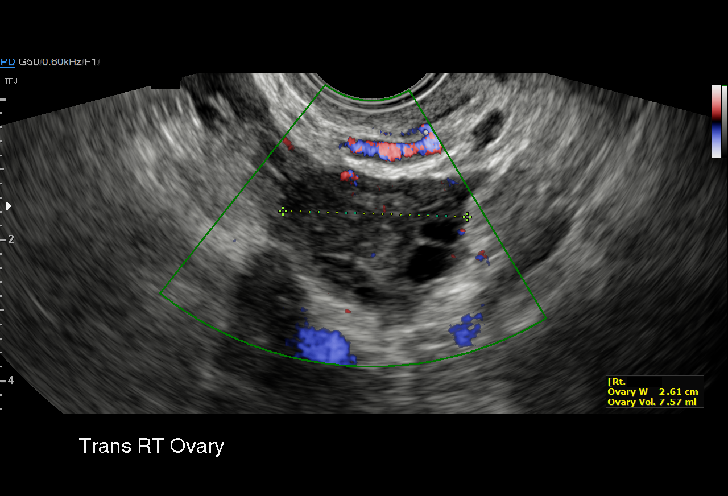

[15 of 25 positions shown; findings below may reference images not displayed]

FINDINGS: Uterus

Measurements: 9.0 x 4.4 x 5.2 cm = volume: 108 mL. Mildly
heterogeneous echogenicity. No focal mass.

Endometrium

Thickness: 7 mm. Small amount of endometrial fluid. No focal lesion.

Right ovary

Measurements: 2.9 x 1.9 x 2.6 cm = volume: 7.6 mL. Normal morphology
without mass.

Left ovary

Measurements: 4.0 x 3.2 x 3.0 cm = volume: 20.0 mL. Complicated
hypoechoic lesion within LEFT ovary measuring 2.6 x 2.6 x 2.5 cm,
favor a hemorrhagic cyst.

Other findings: Small amount of nonspecific free pelvic fluid. No
other adnexal masses.
IMPRESSION: Unremarkable uterus, endometrium, and RIGHT ovary.

Suspected small hemorrhagic cyst of the LEFT ovary 2.6 cm greatest
size; short-interval follow up ultrasound in 6-12 weeks is
recommended, preferably during the week following the patient's
normal menses, to ensure hemorrhagic cyst evolution and exclude
tumor.

## 2019-08-23 ENCOUNTER — Encounter: Payer: Self-pay | Admitting: Psychiatry

## 2019-08-23 ENCOUNTER — Telehealth (INDEPENDENT_AMBULATORY_CARE_PROVIDER_SITE_OTHER): Payer: Medicaid Other | Admitting: Psychiatry

## 2019-08-23 ENCOUNTER — Other Ambulatory Visit: Payer: Self-pay

## 2019-08-23 DIAGNOSIS — F1021 Alcohol dependence, in remission: Secondary | ICD-10-CM

## 2019-08-23 DIAGNOSIS — F4310 Post-traumatic stress disorder, unspecified: Secondary | ICD-10-CM | POA: Diagnosis not present

## 2019-08-23 DIAGNOSIS — Z8659 Personal history of other mental and behavioral disorders: Secondary | ICD-10-CM | POA: Diagnosis not present

## 2019-08-23 DIAGNOSIS — F17200 Nicotine dependence, unspecified, uncomplicated: Secondary | ICD-10-CM | POA: Diagnosis not present

## 2019-08-23 DIAGNOSIS — F3178 Bipolar disorder, in full remission, most recent episode mixed: Secondary | ICD-10-CM | POA: Diagnosis not present

## 2019-08-23 NOTE — Progress Notes (Signed)
Provider Location : [facility] Patient Location : [place]  Virtual Visit via Video Note  I connected with Allison Nunez on 08/23/19 at  8:45 AM EDT by a video enabled telemedicine application and verified that I am speaking with the correct person using two identifiers.   I discussed the limitations of evaluation and management by telemedicine and the availability of in person appointments. The patient expressed understanding and agreed to proceed.   I discussed the assessment and treatment plan with the patient. The patient was provided an opportunity to ask questions and all were answered. The patient agreed with the plan and demonstrated an understanding of the instructions.   The patient was advised to call back or seek an in-person evaluation if the symptoms worsen or if the condition fails to improve as anticipated.   [facility] MD OP Progress Note  08/23/2019 9:27 AM Allison Nunez  MRN:  [id number]  Chief Complaint:  Chief Complaint    Follow-up     HPI: Allison Nunez is a 34 year old Caucasian female, single, lives in [place], employed, has a history of bipolar disorder, PTSD, ADHD, tobacco use disorder, alcohol use disorder in remission was evaluated by telemedicine today.  Patient today reports that she is currently making progress on the current medication regimen.  She denies any significant mood swings.  She reports she had a few episodes of sadness however she was able to get over it and cope with it better.  She reports sleep is improved.  She continues to take prazosin which helps with nightmares.  She is also tolerating the doxepin well.  She current denies any GI symptoms and reports she was diagnosed with irritable bowel syndrome.  She has not had any diarrhea in the past 2 weeks or so.  Patient denies any suicidality, homicidality or perceptual disturbances.  Patient reports she will try to reach out to her therapist to restart psychotherapy sessions again.  She continues to  smoke cigarettes however reports she does not want to try nicotine patches which did not work for her previously.  She is also not interested in Chantix since she is worried about the side effects.  She denies any other concerns today.    Visit Diagnosis:    ICD-10-CM   1. Bipolar disorder, in full remission, most recent episode mixed ([facility])  F31.78   2. PTSD (post-traumatic stress disorder)  F43.10   3. History of ADHD  Z86.59   4. Tobacco use disorder  F17.200   5. Alcohol use disorder, moderate, in sustained remission ([facility])  F10.21     Past Psychiatric History: I have reviewed past psychiatric history from my progress note on 12/25/2018.  Past trials of Depakote-hair loss, Seroquel, Prozac, Wellbutrin, risperidone, Adderall, Ritalin, Geodon  Past Medical History:  Past Medical History:  Diagnosis Date  . ADHD (attention deficit hyperactivity disorder)   . Anxiety   . BV (bacterial vaginosis)   . Cervical dysplasia   . Depression   . Hypothyroidism     Past Surgical History:  Procedure Laterality Date  . TUBAL LIGATION      Family Psychiatric History: I have reviewed family psychiatric history from my progress note on 12/25/2018  Family History:  Family History  Problem Relation Age of Onset  . Anxiety disorder Sister   . Depression Sister   . Alcohol abuse Brother   . Drug abuse Brother     Social History: Reviewed social history from my progress note on 12/25/2018 Social History   Socioeconomic History  .  Marital status: Single    Spouse name: Not on file  . Number of children: 5  . Years of education: Not on file  . Highest education level: High school graduate  Occupational History  . Not on file  Tobacco Use  . Smoking status: Current Every Day Smoker    Packs/day: 1.00    Years: 16.00    Pack years: 16.00    Types: Cigarettes  . Smokeless tobacco: Never Used  . Tobacco comment: 10 Cigarettes per day- reported 07/24/19  Vaping Use  . Vaping Use:  Former  Substance and Sexual Activity  . Alcohol use: Not Currently  . Drug use: Never  . Sexual activity: Yes    Birth control/protection: Condom  Other Topics Concern  . Not on file  Social History Narrative  . Not on file   Social Determinants of Health   Financial Resource Strain: Medium Risk  . Difficulty of Paying Living Expenses: Somewhat hard  Food Insecurity: Food Insecurity Present  . Worried About Programme researcher, broadcasting/film/video in the Last Year: Sometimes true  . Ran Out of Food in the Last Year: Sometimes true  Transportation Needs: No Transportation Needs  . Lack of Transportation (Medical): No  . Lack of Transportation (Non-Medical): No  Physical Activity: Inactive  . Days of Exercise per Week: 0 days  . Minutes of Exercise per Session: 0 min  Stress: Stress Concern Present  . Feeling of Stress : Rather much  Social Connections: Unknown  . Frequency of Communication with Friends and Family: Not on file  . Frequency of Social Gatherings with Friends and Family: Not on file  . Attends Religious Services: Never  . Active Member of Clubs or Organizations: No  . Attends Banker Meetings: Never  . Marital Status: Never married    Allergies: No Known Allergies  Metabolic Disorder Labs: No results found for: HGBA1C, MPG No results found for: PROLACTIN No results found for: CHOL, TRIG, HDL, CHOLHDL, VLDL, LDLCALC No results found for: TSH  Therapeutic Level Labs: No results found for: LITHIUM No results found for: VALPROATE No components found for:  CBMZ  Current Medications: Current Outpatient Medications  Medication Sig Dispense Refill  . ARIPiprazole (ABILIFY) 10 MG tablet Take 1 tablet (10 mg total) by mouth daily. 30 tablet 1  . doxepin (SINEQUAN) 10 MG capsule Take 1-2 capsules (10-20 mg total) by mouth at bedtime as needed. SLEEP 60 capsule 1  . FLUoxetine (PROZAC) 40 MG capsule Take 1 capsule (40 mg total) by mouth daily. 30 capsule 1  .  hydrOXYzine (ATARAX/VISTARIL) 25 MG tablet Take 1 tablet (25 mg total) by mouth 3 (three) times daily as needed for anxiety. 90 tablet 2  . prazosin (MINIPRESS) 1 MG capsule TAKE 1 CAPSULE(1 MG) BY MOUTH AT BEDTIME FOR NIGHTMARES 90 capsule 0   No current facility-administered medications for this visit.     Musculoskeletal: Strength & Muscle Tone: UTA Gait & Station: normal Patient leans: N/A  Psychiatric Specialty Exam: Review of Systems  Psychiatric/Behavioral: Negative for agitation, behavioral problems, confusion, decreased concentration, dysphoric mood, hallucinations, self-injury, sleep disturbance and suicidal ideas. The patient is not nervous/anxious and is not hyperactive.   All other systems reviewed and are negative.   There were no vitals taken for this visit.There is no height or weight on file to calculate BMI.  General Appearance: Casual  Eye Contact:  Fair  Speech:  Clear and Coherent  Volume:  Normal  Mood:  Euthymic  Affect:  Congruent  Thought Process:  Goal Directed and Descriptions of Associations: Intact  Orientation:  Full (Time, Place, and Person)  Thought Content: Logical   Suicidal Thoughts:  No  Homicidal Thoughts:  No  Memory:  Immediate;   Fair Recent;   Fair Remote;   Fair  Judgement:  Fair  Insight:  Fair  Psychomotor Activity:  Normal  Concentration:  Concentration: Fair and Attention Span: Fair  Recall:  [employer] of Knowledge: Fair  Language: Fair  Akathisia:  No  Handed:  Right  AIMS (if indicated): UTA  Assets:  Communication Skills Desire for Improvement Housing Social Support  ADL's:  Intact  Cognition: WNL  Sleep:  Fair   Screenings: GAD-7     [facility] [place] [facility] [place] [facility]  Total GAD-7 Score 18    PHQ2-9     [facility] Office Visit from 01/09/2018 in [facility] for Infectious Disease  PHQ-2 Total Score  4 0  PHQ-9 Total Score 12 --       Assessment and Plan: Allison Nunez  Is a 34 year old Caucasian female, employed, single, lives in [place], has a history of bipolar disorder, ADHD, history of trauma was evaluated by telemedicine today.  Patient is biologically predisposed given her family history as well as history of trauma.  Patient with psychosocial stressors of recent health issues, relationship struggles, being a single mother, job related problems.  She is currently making progress and is stable on her medications.  Plan as noted below.  Plan Bipolar disorder mixed-in remission Abilify 10 mg p.o. daily Doxepin 10 to 20 mg p.o. nightly as needed for sleep  PTSD-improving Prozac 40 mg p.o. daily Prazosin 1 mg p.o. nightly Patient agrees to reach out to her therapist to schedule an appointment.  Tobacco use disorder-improving Provided counseling Patient declines Chantix.  Alcohol use disorder in remission Will monitor closely  History of ADHD-chronic-she is currently not on medications  Follow-up in clinic in 6 weeks or sooner if needed.  I have spent atleast 20 minutes non face to face with patient today. More than 50 % of the time was spent for preparing to see the patient ( e.g., review of test, records ), ordering medications and test ,psychoeducation and supportive psychotherapy and care coordination,as well as documenting clinical information in electronic health record. This note was generated in part or whole with voice recognition software. Voice recognition is usually quite accurate but there are transcription errors that can and very often do occur. I apologize for any typographical errors that were not detected and corrected.      Jomarie Longs, MD 08/23/2019, 9:27 AM

## 2019-08-28 ENCOUNTER — Other Ambulatory Visit: Payer: Self-pay

## 2019-08-28 ENCOUNTER — Ambulatory Visit (INDEPENDENT_AMBULATORY_CARE_PROVIDER_SITE_OTHER): Payer: Medicaid Other | Admitting: Licensed Clinical Social Worker

## 2019-08-28 DIAGNOSIS — F3162 Bipolar disorder, current episode mixed, moderate: Secondary | ICD-10-CM | POA: Diagnosis not present

## 2019-08-28 NOTE — Progress Notes (Addendum)
Patient Location: Home  Provider Location: Home Office   Virtual Visit via Video Note  I connected with Allison Nunez on 08/28/19 at  3:00 PM EDT by a video enabled telemedicine application and verified that I am speaking with the correct person using two identifiers.   I discussed the limitations of evaluation and management by telemedicine and the availability of in person appointments. The patient expressed understanding and agreed to proceed.  THERAPY PROGRESS NOTE  Session Time: 30 Minutes  Participation Level: Active  Behavioral Response: CasualAlertAnxious  Type of Therapy: Individual Therapy  Treatment Goals addressed: Coping and Relationship Stressors  Interventions: CBT and Motivational Interviewing  Summary: Allison Nunez is a 34 y.o. female who presents with Bipolar 1, Moderate and PTSD sxs. Pt reported that the past 3 months has not been going well and reports increase in depression and anxiety sxs since last appointment with therapist for [facility]. Pt identified goals of treatment and obstacles that have hindered patient's progression. Pt identified stressors and her attempts at managing anxiety and depressive sxs as well as use of supports. Pt was receptive to interventions and will work towards setting healthier boundaries.  Suicidal/Homicidal: No  Therapist Response: Therapist met with patient for first session since [facility] in March 2021 after returning from leave. Therapist reviewed initial treatment plan and goals and updated treatment plan to reflect patient's progress. Therapist assisted patient in prioritizing needs to focus on improving relationship with her children and setting healthy boundaries. Therapist provided psychoeducation on stages of change. Therapist elicited commitment from patient to start taking steps towards change by first acknowledging her ambivalence and readiness for change.  Plan: Return again in 1 week.  Diagnosis: Axis I: Bipolar, mixed    Axis  II: N/A    Josephine Igo, LCSW, LCAS 08/28/2019

## 2019-09-04 ENCOUNTER — Telehealth: Payer: Self-pay | Admitting: Licensed Clinical Social Worker

## 2019-09-04 ENCOUNTER — Ambulatory Visit: Payer: Medicaid Other | Admitting: Licensed Clinical Social Worker

## 2019-09-04 ENCOUNTER — Other Ambulatory Visit: Payer: Self-pay

## 2019-09-04 NOTE — Telephone Encounter (Signed)
Therapist attempted to reach patient after no showing virtual visit. There was no answer.

## 2019-10-02 ENCOUNTER — Ambulatory Visit (INDEPENDENT_AMBULATORY_CARE_PROVIDER_SITE_OTHER): Payer: Medicaid Other | Admitting: Licensed Clinical Social Worker

## 2019-10-02 ENCOUNTER — Encounter: Payer: Self-pay | Admitting: Licensed Clinical Social Worker

## 2019-10-02 ENCOUNTER — Other Ambulatory Visit: Payer: Self-pay

## 2019-10-02 DIAGNOSIS — F3162 Bipolar disorder, current episode mixed, moderate: Secondary | ICD-10-CM

## 2019-10-02 NOTE — Progress Notes (Signed)
Patient Location: Home  Provider Location: Home Office   Virtual Visit via Video Note  I connected with Allison Nunez on 10/02/19 at  1:00 PM EDT by a video enabled telemedicine application and verified that I am speaking with the correct person using two identifiers.   I discussed the limitations of evaluation and management by telemedicine and the availability of in person appointments. The patient expressed understanding and agreed to proceed.  THERAPY PROGRESS NOTE  Session Time: 59 Minutes  Participation Level: Active  Behavioral Response: CasualAlertDepressed and Irritable  Type of Therapy: Individual Therapy  Treatment Goals addressed: Anger, Anxiety and Coping  Interventions: CBT and Solution Focused  Summary: Allison Nunez is a 34 y.o. female who presents with Bipolar 1 Disorder. Pt reported that she was feeling really good last time she met with therapist, but has since stopped taking her medication and made some bad choices that she feels ashamed of and made self-defeating comments. Pt reported for the last 3 weeks she has confined herself to her room while still going to work. Pt reported she was reluctant to see therapist because she didn't want to talk to anyone, but made herself call and schedule an appointment today. Pt reported she has started taking her medications again, but doesn't feel any different and is ruminating on negative thoughts. Pt denied any SI or attempts to harm self since last session. Pt reported she cannot trust anyone and doesn't like asking others for help and has no supports to assist her financially.    Suicidal/Homicidal: No  Therapist Response: Therapist met with patient for follow up session after patient no showed last month. Therapist and patient discussed current stressors. Therapist provided psycho-education on cognitive distortions and encouraged patient to identify the content of her thinking for the past 3 weeks. Pt was receptive.  Therapist attempted to help patient problem solve a situation that may have great financial ramifications, however patient was adamant that therapist and no one else could help her with this situation and did not want to elaborate. Patient agreed to meet with therapist next week and to continue taking her medication.  Plan: Return again in 1 week.  Diagnosis: Axis I: Bipolar, mixed    Axis II: N/A  Allison Igo, LCSW, LCAS 10/02/2019

## 2019-10-09 ENCOUNTER — Ambulatory Visit (INDEPENDENT_AMBULATORY_CARE_PROVIDER_SITE_OTHER): Payer: Medicaid Other | Admitting: Licensed Clinical Social Worker

## 2019-10-09 ENCOUNTER — Encounter: Payer: Self-pay | Admitting: Licensed Clinical Social Worker

## 2019-10-09 ENCOUNTER — Other Ambulatory Visit: Payer: Self-pay

## 2019-10-09 DIAGNOSIS — F3162 Bipolar disorder, current episode mixed, moderate: Secondary | ICD-10-CM

## 2019-10-09 DIAGNOSIS — F4310 Post-traumatic stress disorder, unspecified: Secondary | ICD-10-CM | POA: Diagnosis not present

## 2019-10-09 NOTE — Progress Notes (Signed)
Patient Location: Home  Provider Location: Home Office   Virtual Visit via Video Note  I connected with Allison Nunez on 10/09/19 at  2:00 PM EDT by a video enabled telemedicine application and verified that I am speaking with the correct person using two identifiers.   I discussed the limitations of evaluation and management by telemedicine and the availability of in person appointments. The patient expressed understanding and agreed to proceed.  THERAPY PROGRESS NOTE  Session Time:  30 Minutes  Participation Level: Active  Behavioral Response: AlertAnxious  Type of Therapy: Individual Therapy  Treatment Goals addressed: Anxiety and Coping  Interventions: CBT  Summary: Allison Nunez is a 34 y.o. female who presents with Bipolar and PTSD sxs. Pt reported she has stopped dwelling on what may or may not happen regarding her financial situation and is at a place of acceptance. Pt reported she continues to be compliant with medication since restarting. Pt identified triggers for anxiety and would like to work on overcoming fear of social situations so she can leave the house more and find a better paying job. Pt listed her fears and provided examples of the past that have influenced her present day anxiety.   Suicidal/Homicidal: No  Therapist Response: Therapist met with patient for follow up session. Therapist and patient discussed current experience of sxs and how patient is managing them. Therapist encouraged patient to identify common anxiety provoking situations and rank order from most to least feared. Pt was receptive. Therapist provided psychoeducation on exposure therapy and use of relaxation techniques to pair with feared situations.  Plan: Return again in 2 weeks.  Diagnosis: Axis I: Bipolar, mixed and Post Traumatic Stress Disorder    Axis II: N/A  Josephine Igo, LCSW, LCAS 10/09/2019

## 2019-10-18 ENCOUNTER — Other Ambulatory Visit: Payer: Self-pay

## 2019-10-18 ENCOUNTER — Encounter: Payer: Self-pay | Admitting: Psychiatry

## 2019-10-18 ENCOUNTER — Telehealth (INDEPENDENT_AMBULATORY_CARE_PROVIDER_SITE_OTHER): Payer: Medicaid Other | Admitting: Psychiatry

## 2019-10-18 DIAGNOSIS — F17200 Nicotine dependence, unspecified, uncomplicated: Secondary | ICD-10-CM

## 2019-10-18 DIAGNOSIS — F3162 Bipolar disorder, current episode mixed, moderate: Secondary | ICD-10-CM

## 2019-10-18 DIAGNOSIS — Z8659 Personal history of other mental and behavioral disorders: Secondary | ICD-10-CM

## 2019-10-18 DIAGNOSIS — F4310 Post-traumatic stress disorder, unspecified: Secondary | ICD-10-CM | POA: Diagnosis not present

## 2019-10-18 DIAGNOSIS — F1021 Alcohol dependence, in remission: Secondary | ICD-10-CM

## 2019-10-18 MED ORDER — DOXEPIN HCL 10 MG PO CAPS: 10.0000 mg | ORAL_CAPSULE | Freq: Every evening | ORAL | 2 refills | Status: DC | PRN

## 2019-10-18 MED ORDER — PRAZOSIN HCL 1 MG PO CAPS: ORAL_CAPSULE | ORAL | 2 refills | Status: DC

## 2019-10-18 MED ORDER — ARIPIPRAZOLE 10 MG PO TABS: 10.0000 mg | ORAL_TABLET | Freq: Every day | ORAL | 2 refills | Status: DC

## 2019-10-18 MED ORDER — FLUOXETINE HCL 40 MG PO CAPS: 40.0000 mg | ORAL_CAPSULE | Freq: Every day | ORAL | 2 refills | Status: DC

## 2019-10-18 MED ORDER — VARENICLINE TARTRATE 0.5 MG PO TABS: 0.5000 mg | ORAL_TABLET | Freq: Two times a day (BID) | ORAL | 1 refills | Status: DC

## 2019-10-18 MED ORDER — HYDROXYZINE HCL 25 MG PO TABS: 25.0000 mg | ORAL_TABLET | Freq: Three times a day (TID) | ORAL | 2 refills | Status: DC | PRN

## 2019-10-18 NOTE — Patient Instructions (Signed)
Varenicline oral tablets What is this medicine? VARENICLINE (var e NI kleen) is used to help people quit smoking. It is used with a patient support program recommended by your physician. This medicine may be used for other purposes; ask your health care provider or pharmacist if you have questions. COMMON BRAND NAME(S): Chantix What should I tell my health care provider before I take this medicine? They need to know if you have any of these conditions:  heart disease  if you often drink alcohol  kidney disease  mental illness  on hemodialysis  seizures  history of stroke  suicidal thoughts, plans, or attempt; a previous suicide attempt by you or a family member  an unusual or allergic reaction to varenicline, other medicines, foods, dyes, or preservatives  pregnant or trying to get pregnant  breast-feeding How should I use this medicine? Take this medicine by mouth after eating. Take with a full glass of water. Follow the directions on the prescription label. Take your doses at regular intervals. Do not take your medicine more often than directed. There are 3 ways you can use this medicine to help you quit smoking; talk to your health care professional to decide which plan is right for you: 1) you can choose a quit date and start this medicine 1 week before the quit date, or, 2) you can start taking this medicine before you choose a quit date, and then pick a quit date between day 8 and 35 days of treatment, or, 3) if you are not sure that you are able or willing to quit smoking right away, start taking this medicine and slowly decrease the amount you smoke as directed by your health care professional with the goal of being cigarette-free by week 12 of treatment. Stick to your plan; ask about support groups or other ways to help you remain cigarette-free. If you are motivated to quit smoking and did not succeed during a previous attempt with this medicine for reasons other than  side effects, or if you returned to smoking after this treatment, speak with your health care professional about whether another course of this medicine may be right for you. A special MedGuide will be given to you by the pharmacist with each prescription and refill. Be sure to read this information carefully each time. Talk to your pediatrician regarding the use of this medicine in children. This medicine is not approved for use in children. Overdosage: If you think you have taken too much of this medicine contact a poison control center or emergency room at once. NOTE: This medicine is only for you. Do not share this medicine with others. What if I miss a dose? If you miss a dose, take it as soon as you can. If it is almost time for your next dose, take only that dose. Do not take double or extra doses. What may interact with this medicine?  alcohol  insulin  other medicines used to help people quit smoking  theophylline  warfarin This list may not describe all possible interactions. Give your health care provider a list of all the medicines, herbs, non-prescription drugs, or dietary supplements you use. Also tell them if you smoke, drink alcohol, or use illegal drugs. Some items may interact with your medicine. What should I watch for while using this medicine? It is okay if you do not succeed at your attempt to quit and have a cigarette. You can still continue your quit attempt and keep using this medicine as directed.   Just throw away your cigarettes and get back to your quit plan. Talk to your health care provider before using other treatments to quit smoking. Using this medicine with other treatments to quit smoking may increase the risk for side effects compared to using a treatment alone. You may get drowsy or dizzy. Do not drive, use machinery, or do anything that needs mental alertness until you know how this medicine affects you. Do not stand or sit up quickly, especially if you are  an older patient. This reduces the risk of dizzy or fainting spells. Decrease the number of alcoholic beverages that you drink during treatment with this medicine until you know if this medicine affects your ability to tolerate alcohol. Some people have experienced increased drunkenness (intoxication), unusual or sometimes aggressive behavior, or no memory of things that have happened (amnesia) during treatment with this medicine. Sleepwalking can happen during treatment with this medicine, and can sometimes lead to behavior that is harmful to you, other people, or property. Stop taking this medicine and tell your doctor if you start sleepwalking or have other unusual sleep-related activity. After taking this medicine, you may get up out of bed and do an activity that you do not know you are doing. The next morning, you may have no memory of this. Activities include driving a car ("sleep-driving"), making and eating food, talking on the phone, sexual activity, and sleep-walking. Serious injuries have occurred. Stop the medicine and call your doctor right away if you find out you have done any of these activities. Do not take this medicine if you have used alcohol that evening. Do not take it if you have taken another medicine for sleep. The risk of doing these sleep-related activities is higher. Patients and their families should watch out for new or worsening depression or thoughts of suicide. Also watch out for sudden changes in feelings such as feeling anxious, agitated, panicky, irritable, hostile, aggressive, impulsive, severely restless, overly excited and hyperactive, or not being able to sleep. If this happens, call your health care professional. If you have diabetes and you quit smoking, the effects of insulin may be increased and you may need to reduce your insulin dose. Check with your doctor or health care professional about how you should adjust your insulin dose. What side effects may I notice  from receiving this medicine? Side effects that you should report to your doctor or health care professional as soon as possible:  allergic reactions like skin rash, itching or hives, swelling of the face, lips, tongue, or throat  acting aggressive, being angry or violent, or acting on dangerous impulses  breathing problems  changes in emotions or moods  chest pain or chest tightness  feeling faint or lightheaded, falls  hallucination, loss of contact with reality  mouth sores  redness, blistering, peeling or loosening of the skin, including inside the mouth  signs and symptoms of a stroke like changes in vision; confusion; trouble speaking or understanding; severe headaches; sudden numbness or weakness of the face, arm or leg; trouble walking; dizziness; loss of balance or coordination  seizures  sleepwalking  suicidal thoughts or other mood changes Side effects that usually do not require medical attention (report to your doctor or health care professional if they continue or are bothersome):  constipation  gas  headache  nausea, vomiting  strange dreams  trouble sleeping This list may not describe all possible side effects. Call your doctor for medical advice about side effects. You may report side   effects to FDA at 1-800-FDA-1088. Where should I keep my medicine? Keep out of the reach of children. Store at room temperature between 15 and 30 degrees C (59 and 86 degrees F). Throw away any unused medicine after the expiration date. NOTE: This sheet is a summary. It may not cover all possible information. If you have questions about this medicine, talk to your doctor, pharmacist, or health care provider.  2020 Elsevier/Gold Standard (2018-02-02 14:27:36)  

## 2019-10-18 NOTE — Progress Notes (Signed)
Provider Location : [facility] Patient Location : Home  Participants: Patient , Provider  Virtual Visit via Video Note  I connected with Allison Nunez on 10/18/19 at  9:00 AM EDT by a video enabled telemedicine application and verified that I am speaking with the correct person using two identifiers.   I discussed the limitations of evaluation and management by telemedicine and the availability of in person appointments. The patient expressed understanding and agreed to proceed.     I discussed the assessment and treatment plan with the patient. The patient was provided an opportunity to ask questions and all were answered. The patient agreed with the plan and demonstrated an understanding of the instructions.   The patient was advised to call back or seek an in-person evaluation if the symptoms worsen or if the condition fails to improve as anticipated.   [facility] MD OP Progress Note  10/18/2019 10:18 AM Allison Nunez  MRN:  [id number]  Chief Complaint:  Chief Complaint    Follow-up     HPI: Allison Nunez is a 34 year old Caucasian female, single, lives in [place], employed, has a history of bipolar disorder, PTSD, ADHD, tobacco use disorder, alcohol use disorder in remission was evaluated by telemedicine today.  Patient today reports she had stopped all her medications few weeks ago since she was feeling better.  She however reports that triggered mood swings and sleep issues and hence she went back on the medications beginning of August.  Since going back on the medication she has been feeling much better.  Patient reports sleep is improving.  She does have GI symptoms of diarrhea on and off.  It usually happens if she has a stressful day.  She reports she might wake up at night 2-3 times a week due to GI upset.  She however reports she is planning to take a medication which was prescribed by her provider starting today and is hopeful that will help.  Patient reports due to her recent  stressors and worsening mood symptoms her smoking got worse.  She however is interested in smoking cessation.  Discussed Chantix.  Patient has never tried it before.  Patient denies any suicidality, homicidality or perceptual disturbances.  She has been compliant with psychotherapy sessions and reports it is beneficial.  Patient with psychosocial stressors of the pandemic, children going back to school, her work hours being reduced and so on.  Patient denies any other concerns today.  Visit Diagnosis:    ICD-10-CM   1. Bipolar 1 disorder, mixed, moderate (HCC)  F31.62 ARIPiprazole (ABILIFY) 10 MG tablet    FLUoxetine (PROZAC) 40 MG capsule  2. PTSD (post-traumatic stress disorder)  F43.10 doxepin (SINEQUAN) 10 MG capsule    FLUoxetine (PROZAC) 40 MG capsule    prazosin (MINIPRESS) 1 MG capsule    hydrOXYzine (ATARAX/VISTARIL) 25 MG tablet  3. Tobacco use disorder  F17.200 varenicline (CHANTIX) 0.5 MG tablet  4. History of ADHD  Z86.59   5. Alcohol use disorder, moderate, in sustained remission (HCC)  F10.21     Past Psychiatric History: I have reviewed past psychiatric history from my progress note on 12/25/2018.  Past trials of Depakote-hair loss, Seroquel, Prozac, Wellbutrin, risperidone, Adderall, Ritalin, Geodon  Past Medical History:  Past Medical History:  Diagnosis Date  . ADHD (attention deficit hyperactivity disorder)   . Anxiety   . BV (bacterial vaginosis)   . Cervical dysplasia   . Depression   . Hypothyroidism     Past Surgical History:  Procedure Laterality  Date  . TUBAL LIGATION      Family Psychiatric History: I have reviewed family psychiatric history from my progress note on 12/25/2018  Family History:  Family History  Problem Relation Age of Onset  . Anxiety disorder Sister   . Depression Sister   . Alcohol abuse Brother   . Drug abuse Brother     Social History: Reviewed social history from my progress note on 12/25/2018 Social History    Socioeconomic History  . Marital status: Single    Spouse name: Not on file  . Number of children: 5  . Years of education: Not on file  . Highest education level: High school graduate  Occupational History  . Not on file  Tobacco Use  . Smoking status: Current Every Day Smoker    Packs/day: 1.00    Years: 16.00    Pack years: 16.00    Types: Cigarettes  . Smokeless tobacco: Never Used  . Tobacco comment: 10 Cigarettes per day- reported 07/24/19  Vaping Use  . Vaping Use: Former  Substance and Sexual Activity  . Alcohol use: Not Currently  . Drug use: Never  . Sexual activity: Yes    Birth control/protection: Condom  Other Topics Concern  . Not on file  Social History Narrative  . Not on file   Social Determinants of Health   Financial Resource Strain: Medium Risk  . Difficulty of Paying Living Expenses: Somewhat hard  Food Insecurity: Food Insecurity Present  . Worried About Programme researcher, broadcasting/film/video in the Last Year: Sometimes true  . Ran Out of Food in the Last Year: Sometimes true  Transportation Needs: No Transportation Needs  . Lack of Transportation (Medical): No  . Lack of Transportation (Non-Medical): No  Physical Activity: Inactive  . Days of Exercise per Week: 0 days  . Minutes of Exercise per Session: 0 min  Stress: Stress Concern Present  . Feeling of Stress : Rather much  Social Connections: Unknown  . Frequency of Communication with Friends and Family: Not on file  . Frequency of Social Gatherings with Friends and Family: Not on file  . Attends Religious Services: Never  . Active Member of Clubs or Organizations: No  . Attends Banker Meetings: Never  . Marital Status: Never married    Allergies: No Known Allergies  Metabolic Disorder Labs: No results found for: HGBA1C, MPG No results found for: PROLACTIN No results found for: CHOL, TRIG, HDL, CHOLHDL, VLDL, LDLCALC No results found for: TSH  Therapeutic Level Labs: No results  found for: LITHIUM No results found for: VALPROATE No components found for:  CBMZ  Current Medications: Current Outpatient Medications  Medication Sig Dispense Refill  . ARIPiprazole (ABILIFY) 10 MG tablet Take 1 tablet (10 mg total) by mouth daily. 30 tablet 2  . cephALEXin (KEFLEX) 500 MG capsule Take 500 mg by mouth 3 (three) times daily.    Marland Kitchen doxepin (SINEQUAN) 10 MG capsule Take 1-2 capsules (10-20 mg total) by mouth at bedtime as needed. SLEEP 60 capsule 2  . fluconazole (DIFLUCAN) 150 MG tablet Take 1 tablet by mouth single dose  for yeast infection. May repeat in 3 days if symptoms persist    . FLUoxetine (PROZAC) 40 MG capsule Take 1 capsule (40 mg total) by mouth daily. 30 capsule 2  . HYDROcodone-acetaminophen (NORCO/VICODIN) 5-325 MG tablet Take 1 tablet by mouth every 4 (four) hours as needed.    . hydrOXYzine (ATARAX/VISTARIL) 25 MG tablet Take 1 tablet (25 mg  total) by mouth 3 (three) times daily as needed for anxiety. 90 tablet 2  . nicotine (NICODERM CQ - DOSED IN MG/24 HR) 7 mg/24hr patch     . prazosin (MINIPRESS) 1 MG capsule TAKE 1 CAPSULE(1 MG) BY MOUTH AT BEDTIME FOR NIGHTMARES 30 capsule 2  . tinidazole (TINDAMAX) 500 MG tablet Take 500 mg by mouth 2 (two) times daily.    . varenicline (CHANTIX) 0.5 MG tablet Take 1 tablet (0.5 mg total) by mouth 2 (two) times daily. For smoking 60 tablet 1   No current facility-administered medications for this visit.     Musculoskeletal: Strength & Muscle Tone: UTA Gait & Station: normal Patient leans: N/A  Psychiatric Specialty Exam: Review of Systems  Gastrointestinal: Positive for diarrhea (improving).  Psychiatric/Behavioral:       Mood swings - improving  All other systems reviewed and are negative.   There were no vitals taken for this visit.There is no height or weight on file to calculate BMI.  General Appearance: Casual  Eye Contact:  Fair  Speech:  Clear and Coherent  Volume:  Normal  Mood:  Mood swings -  improving  Affect:  Congruent  Thought Process:  Goal Directed and Descriptions of Associations: Intact  Orientation:  Full (Time, Place, and Person)  Thought Content: Logical   Suicidal Thoughts:  No  Homicidal Thoughts:  No  Memory:  Immediate;   Fair Recent;   Fair Remote;   Fair  Judgement:  Fair  Insight:  Fair  Psychomotor Activity:  Normal  Concentration:  Concentration: Fair and Attention Span: Fair  Recall:  [employer] of Knowledge: Fair  Language: Fair  Akathisia:  No  Handed:  Right  AIMS (if indicated): UTA  Assets:  Communication Skills Desire for Improvement Housing Social Support  ADL's:  Intact  Cognition: WNL  Sleep:  Fair   Screenings: GAD-7     [facility] Visit from [facility]/24/2018 in [facility]  Total GAD-7 Score 18    PHQ2-9     Office Visit from 04/24/2018 in [facility] Office Visit from 01/09/2018 in [facility]  PHQ-2 Total Score 4 0  PHQ-9 Total Score 12 --       Assessment and Plan: Kenniya Westrich is a 34 year old Caucasian female, employed, single, lives in [place], has a history of bipolar disorder, ADHD, history of trauma was evaluated by telemedicine today.  Patient is biologically predisposed given her family history as well as history of trauma.  Patient with psychosocial stressors of recent health issues, relationship struggles, being a single mother, the pandemic, job related problems, financial stressors.  Patient was noncompliant with medications however currently is back on the medications and reports mood symptoms as improving.  Plan as noted below.  Plan Bipolar disorder mixed-improving Abilify 10 mg p.o. daily Doxepin 10 to 20 mg p.o. nightly as needed  PTSD-improving Prozac 40 mg p.o. daily Prazosin 1 mg p.o. nightly Patient to continue psychotherapy sessions with Ms. Rochele Pages  Tobacco use disorder-improving Provided counseling Start  Chantix 0.5 mg p.o. twice daily  Alcohol use disorder in remission We will monitor closely  History of ADHD-chronic-she is currently not on medications.  Encouraged compliance.  Follow-up in clinic in 4 to 6 weeks or sooner if needed.  I have spent atleast 20 minutes face to face with patient today. More than 50 % of the time was spent for preparing to see the patient (  e.g., review of test, records ),  ordering medications and test ,psychoeducation and supportive psychotherapy and care coordination,as well as documenting clinical information in electronic health record. This note was generated in part or whole with voice recognition software. Voice recognition is usually quite accurate but there are transcription errors that can and very often do occur. I apologize for any typographical errors that were not detected and corrected.        Jomarie Longs, MD 10/18/2019, 10:18 AM

## 2019-10-23 ENCOUNTER — Telehealth: Payer: Self-pay | Admitting: Licensed Clinical Social Worker

## 2019-10-23 ENCOUNTER — Ambulatory Visit: Payer: Medicaid Other | Admitting: Licensed Clinical Social Worker

## 2019-10-23 ENCOUNTER — Other Ambulatory Visit: Payer: Self-pay

## 2019-10-23 NOTE — Telephone Encounter (Signed)
Therapist attempted to reach patient via phone after no response to invites for session via email and text. Pt answered phone s/ that she was about to leave to go to dinner with family while on vacation. Pt s/ she did not receive the links for video chat invite and that his may be due to bad wifi. Pt canceled session.

## 2019-10-30 ENCOUNTER — Encounter: Payer: Self-pay | Admitting: Licensed Clinical Social Worker

## 2019-10-30 ENCOUNTER — Ambulatory Visit (INDEPENDENT_AMBULATORY_CARE_PROVIDER_SITE_OTHER): Payer: Medicaid Other | Admitting: Licensed Clinical Social Worker

## 2019-10-30 ENCOUNTER — Other Ambulatory Visit: Payer: Self-pay

## 2019-10-30 DIAGNOSIS — F3162 Bipolar disorder, current episode mixed, moderate: Secondary | ICD-10-CM

## 2019-10-30 DIAGNOSIS — F4310 Post-traumatic stress disorder, unspecified: Secondary | ICD-10-CM | POA: Diagnosis not present

## 2019-10-30 NOTE — Progress Notes (Signed)
Patient Location: Home  Provider Location: Home Office   Virtual Visit via Video Note  I connected with Allison Nunez on 10/30/19 at  2:00 PM EDT by a video enabled telemedicine application and verified that I am speaking with the correct person using two identifiers.   I discussed the limitations of evaluation and management by telemedicine and the availability of in person appointments. The patient expressed understanding and agreed to proceed.  THERAPY PROGRESS NOTE  Session Time: 30 Minutes  Participation Level: Active  Behavioral Response: LethargicIrritable and Tired  Type of Therapy: Individual Therapy  Treatment Goals addressed: Anxiety and Coping  Interventions: CBT  Summary: Allison Nunez is a 34 y.o. female who presents with Bipolar and PTSD sxs. Pt reported she is not feeling well mentally or physically and believes it is related to stress experienced while on vacation with family. Pt identified relationship dynamics and behavioral issues that some of her children are displaying. Pt described feeling overwhelmed and fatigued from the stress and uses the time the kids are in school to decompress. Pt reported she has camera off throughout session due to low battery on her phone.  Suicidal/Homicidal: No  Therapist Response: Therapist met with patient for follow up session. Therapist reviewed reading material sent to patient after last session related to the fear/exposure hierarchy exercise and resource for service/emotional support animals. Therapist and patient discussed current stressors and how patient is coping. Therapist validated patient as she processed her thoughts and feelings. Pt was receptive. Therapist encouraged patient to practice relaxation techniques between now and next session.  Plan: Return again in 2 weeks.  Diagnosis: Axis I: Bipolar, mixed and Post Traumatic Stress Disorder    Axis II: N/A  Josephine Igo, LCSW, LCAS 10/30/2019

## 2019-11-08 ENCOUNTER — Telehealth: Payer: Self-pay

## 2019-11-08 DIAGNOSIS — F17200 Nicotine dependence, unspecified, uncomplicated: Secondary | ICD-10-CM

## 2019-11-08 MED ORDER — NICOTINE 14 MG/24HR TD PT24: 14.0000 mg | MEDICATED_PATCH | Freq: Every day | TRANSDERMAL | 0 refills | Status: DC

## 2019-11-08 NOTE — Telephone Encounter (Signed)
pt called states that the chantix is not working please advised.

## 2019-11-08 NOTE — Telephone Encounter (Signed)
Returned call to patient.  Discussed with her to stop the Chantix and she was having nightmares.  Start NicoDerm patch-high dosage of 14 mg since she tried the 7 mg and it did not help much.  We will send to pharmacy.  Also discussed referral to smoking cessation class.  She will Pharmacist, hospital know.

## 2019-11-13 ENCOUNTER — Encounter: Payer: Self-pay | Admitting: Licensed Clinical Social Worker

## 2019-11-13 ENCOUNTER — Ambulatory Visit (INDEPENDENT_AMBULATORY_CARE_PROVIDER_SITE_OTHER): Payer: Medicaid Other | Admitting: Licensed Clinical Social Worker

## 2019-11-13 ENCOUNTER — Other Ambulatory Visit: Payer: Self-pay

## 2019-11-13 DIAGNOSIS — F3162 Bipolar disorder, current episode mixed, moderate: Secondary | ICD-10-CM | POA: Diagnosis not present

## 2019-11-13 DIAGNOSIS — F17200 Nicotine dependence, unspecified, uncomplicated: Secondary | ICD-10-CM

## 2019-11-13 NOTE — Progress Notes (Signed)
Patient Location: Art gallery manager Location: Home Office   Virtual Visit via Telephone Note  I connected with [name] on 11/13/19 at  2:00 PM EDT by telephone and verified that I am speaking with the correct person using two identifiers.   I discussed the limitations, risks, security and privacy concerns of performing an evaluation and management service by telephone and the availability of in person appointments. I also discussed with the patient that there may be a patient responsible charge related to this service. The patient expressed understanding and agreed to proceed.  THERAPY PROGRESS NOTE  Session Time: 5 Minutes  Participation Level: Active  Behavioral Response: AlertIrritable  Type of Therapy: Individual Therapy  Treatment Goals addressed: Anger  Interventions: Other: Quick Check-In  Summary: Allison Nunez is a 34 y.o. female who presents with Bipolar and PTSD dx. Pt accepted video session invite and asked if therapist could call patient over the phone since she was driving. Pt reported that one of her kids is having sinus issues and was released early from school to get tested for COVID and that her other child was suspended from being able to ride the school bus for the next 2 weeks which means patient will have to be doing a lot more transportation for them. Since children were in the vehicle, patient preferred to just do a quick check-in and schedule for next time. Pt reported she did receive the nicotine patches after calling Dr. Elna Breslow about medication concerns.  Suicidal/Homicidal: No  Therapist Response: Therapist attempted to meet with patient for follow up session. Therapist asked open ended and clarifying questions to assess for needs today and offered another appointment time for follow up. Pt was receptive.  Plan: Return again in 2 weeks.  Diagnosis: Axis I: Bipolar, mixed and Post Traumatic Stress Disorder    Axis II: N/A  Loyal Gambler, LCSW,  LCAS 11/13/2019

## 2019-11-27 ENCOUNTER — Other Ambulatory Visit: Payer: Self-pay

## 2019-11-27 ENCOUNTER — Ambulatory Visit (INDEPENDENT_AMBULATORY_CARE_PROVIDER_SITE_OTHER): Payer: Medicaid Other | Admitting: Licensed Clinical Social Worker

## 2019-11-27 ENCOUNTER — Encounter: Payer: Self-pay | Admitting: Licensed Clinical Social Worker

## 2019-11-27 DIAGNOSIS — F3162 Bipolar disorder, current episode mixed, moderate: Secondary | ICD-10-CM

## 2019-11-27 DIAGNOSIS — F4010 Social phobia, unspecified: Secondary | ICD-10-CM

## 2019-11-27 DIAGNOSIS — F17200 Nicotine dependence, unspecified, uncomplicated: Secondary | ICD-10-CM | POA: Diagnosis not present

## 2019-11-27 DIAGNOSIS — F4310 Post-traumatic stress disorder, unspecified: Secondary | ICD-10-CM | POA: Diagnosis not present
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
## 2019-11-29 ENCOUNTER — Telehealth (INDEPENDENT_AMBULATORY_CARE_PROVIDER_SITE_OTHER): Payer: Medicaid Other | Admitting: Psychiatry

## 2019-11-29 ENCOUNTER — Encounter: Payer: Self-pay | Admitting: Psychiatry

## 2019-11-29 ENCOUNTER — Other Ambulatory Visit: Payer: Self-pay

## 2019-11-29 DIAGNOSIS — Z8659 Personal history of other mental and behavioral disorders: Secondary | ICD-10-CM | POA: Diagnosis not present

## 2019-11-29 DIAGNOSIS — F17200 Nicotine dependence, unspecified, uncomplicated: Secondary | ICD-10-CM

## 2019-11-29 DIAGNOSIS — F1021 Alcohol dependence, in remission: Secondary | ICD-10-CM

## 2019-11-29 DIAGNOSIS — F4310 Post-traumatic stress disorder, unspecified: Secondary | ICD-10-CM

## 2019-11-29 DIAGNOSIS — F3162 Bipolar disorder, current episode mixed, moderate: Secondary | ICD-10-CM

## 2019-11-29 DIAGNOSIS — Z79899 Other long term (current) drug therapy: Secondary | ICD-10-CM

## 2019-11-29 MED ORDER — ARIPIPRAZOLE 10 MG PO TABS: 5.0000 mg | ORAL_TABLET | Freq: Every day | ORAL | 2 refills | Status: DC

## 2019-11-29 MED ORDER — CARBAMAZEPINE ER 100 MG PO TB12: 100.0000 mg | ORAL_TABLET | Freq: Two times a day (BID) | ORAL | 1 refills | Status: DC

## 2019-11-29 NOTE — Progress Notes (Signed)
Provider Location : [facility] Patient Location : Car  Participants: Patient , Provider  Virtual Visit via Video Note  I connected with Allison Nunez on 11/29/19 at 10:30 AM EDT by a video enabled telemedicine application and verified that I am speaking with the correct person using two identifiers.   I discussed the limitations of evaluation and management by telemedicine and the availability of in person appointments. The patient expressed understanding and agreed to proceed.    I discussed the assessment and treatment plan with the patient. The patient was provided an opportunity to ask questions and all were answered. The patient agreed with the plan and demonstrated an understanding of the instructions.   The patient was advised to call back or seek an in-person evaluation if the symptoms worsen or if the condition fails to improve as anticipated.   BH MD OP Progress Note  11/29/2019 12:25 PM Allison Nunez  MRN:  [id number]  Chief Complaint:  Chief Complaint    Follow-up     HPI: Allison Nunez is a 34 year old Caucasian female, single, lives in [place], employed, has a history of bipolar disorder, PTSD, ADHD, tobacco use disorder, alcohol use disorder in remission was evaluated by telemedicine today.  Patient today reports she has noticed some changes in her mood since the past few weeks.  She reports ever since she stopped her medications and then restarted it in August she has not felt the same.  She currently describes feeling overwhelmed, anxious or agitated inside however she feels as though her body is sluggish and zombielike and she does not have enough energy in her body to keep up with what she is feeling inside.  She is not sure if this is medication side effects and she reports she felt good on the Abilify initially when she started it.  She does report sleep restlessness mostly due to vivid dreams.  She continues to take the prazosin.  She is not taking the Chantix anymore.   She is on nicotine patches.  She was trying to cut back however since she is not feeling well she is not able to keep up with that anymore.  Patient denies any suicidality, homicidality or perceptual disturbances.  She is compliant on her medications as prescribed.  She continues to follow-up with her therapist and reports therapy sessions as beneficial.  Visit Diagnosis:    ICD-10-CM   1. Bipolar 1 disorder, mixed, moderate (HCC)  F31.62 ARIPiprazole (ABILIFY) 10 MG tablet    carbamazepine (TEGRETOL-XR) 100 MG 12 hr tablet  2. PTSD (post-traumatic stress disorder)  F43.10   3. Tobacco use disorder  F17.200   4. History of ADHD  Z86.59   5. Alcohol use disorder, moderate, in sustained remission (HCC)  F10.21   6. High risk medication use  Z79.899 Carbamazepine level, total    CBC With Diff/Platelet    CMP and Liver    Lipid panel    Hemoglobin A1C    Past Psychiatric History: I have reviewed past psychiatric history from my progress note on 12/25/2018.  Past trials of Depakote-hair loss, Seroquel, Prozac, Wellbutrin, risperidone, Adderall, Ritalin, Geodon  Past Medical History:  Past Medical History:  Diagnosis Date  . ADHD (attention deficit hyperactivity disorder)   . Anxiety   . BV (bacterial vaginosis)   . Cervical dysplasia   . Depression   . Hypothyroidism     Past Surgical History:  Procedure Laterality Date  . TUBAL LIGATION      Family Psychiatric History: I  have reviewed family psychiatric history from my progress note on 12/25/2018  Family History:  Family History  Problem Relation Age of Onset  . Anxiety disorder Sister   . Depression Sister   . Alcohol abuse Brother   . Drug abuse Brother     Social History: Reviewed social history from my progress note on 12/25/2018 Social History   Socioeconomic History  . Marital status: Single    Spouse name: Not on file  . Number of children: 5  . Years of education: Not on file  . Highest education level:  High school graduate  Occupational History  . Not on file  Tobacco Use  . Smoking status: Current Every Day Smoker    Packs/day: 1.00    Years: 16.00    Pack years: 16.00    Types: Cigarettes  . Smokeless tobacco: Never Used  . Tobacco comment: 10 Cigarettes per day- reported 07/24/19  Vaping Use  . Vaping Use: Former  Substance and Sexual Activity  . Alcohol use: Not Currently  . Drug use: Never  . Sexual activity: Yes    Birth control/protection: Condom  Other Topics Concern  . Not on file  Social History Narrative  . Not on file   Social Determinants of Health   Financial Resource Strain: Medium Risk  . Difficulty of Paying Living Expenses: Somewhat hard  Food Insecurity: Food Insecurity Present  . Worried About Programme researcher, broadcasting/film/video in the Last Year: Sometimes true  . Ran Out of Food in the Last Year: Sometimes true  Transportation Needs: No Transportation Needs  . Lack of Transportation (Medical): No  . Lack of Transportation (Non-Medical): No  Physical Activity: Inactive  . Days of Exercise per Week: 0 days  . Minutes of Exercise per Session: 0 min  Stress: Stress Concern Present  . Feeling of Stress : Rather much  Social Connections: Unknown  . Frequency of Communication with Friends and Family: Not on file  . Frequency of Social Gatherings with Friends and Family: Not on file  . Attends Religious Services: Never  . Active Member of Clubs or Organizations: No  . Attends Banker Meetings: Never  . Marital Status: Never married    Allergies: No Known Allergies  Metabolic Disorder Labs: No results found for: HGBA1C, MPG No results found for: PROLACTIN No results found for: CHOL, TRIG, HDL, CHOLHDL, VLDL, LDLCALC No results found for: TSH  Therapeutic Level Labs: No results found for: LITHIUM No results found for: VALPROATE No components found for:  CBMZ  Current Medications: Current Outpatient Medications  Medication Sig Dispense Refill  .  ARIPiprazole (ABILIFY) 10 MG tablet Take 0.5 tablets (5 mg total) by mouth daily. 30 tablet 2  . carbamazepine (TEGRETOL-XR) 100 MG 12 hr tablet Take 1 tablet (100 mg total) by mouth 2 (two) times daily. 60 tablet 1  . cephALEXin (KEFLEX) 500 MG capsule Take 500 mg by mouth 3 (three) times daily.    Marland Kitchen doxepin (SINEQUAN) 10 MG capsule Take 1-2 capsules (10-20 mg total) by mouth at bedtime as needed. SLEEP 60 capsule 2  . fluconazole (DIFLUCAN) 150 MG tablet Take 1 tablet by mouth single dose  for yeast infection. May repeat in 3 days if symptoms persist    . FLUoxetine (PROZAC) 40 MG capsule Take 1 capsule (40 mg total) by mouth daily. 30 capsule 2  . HYDROcodone-acetaminophen (NORCO/VICODIN) 5-325 MG tablet Take 1 tablet by mouth every 4 (four) hours as needed.    Marland Kitchen  hydrOXYzine (ATARAX/VISTARIL) 25 MG tablet Take 1 tablet (25 mg total) by mouth 3 (three) times daily as needed for anxiety. 90 tablet 2  . nicotine (NICODERM CQ - DOSED IN MG/24 HOURS) 14 mg/24hr patch Place 1 patch (14 mg total) onto the skin daily. 28 patch 0  . nicotine (NICODERM CQ - DOSED IN MG/24 HR) 7 mg/24hr patch     . prazosin (MINIPRESS) 1 MG capsule TAKE 1 CAPSULE(1 MG) BY MOUTH AT BEDTIME FOR NIGHTMARES 30 capsule 2  . tinidazole (TINDAMAX) 500 MG tablet Take 500 mg by mouth 2 (two) times daily.     No current facility-administered medications for this visit.     Musculoskeletal: Strength & Muscle Tone: UTA Gait & Station: normal Patient leans: N/A  Psychiatric Specialty Exam: Review of Systems  Psychiatric/Behavioral: Positive for sleep disturbance. The patient is nervous/anxious.   All other systems reviewed and are negative.   There were no vitals taken for this visit.There is no height or weight on file to calculate BMI.  General Appearance: Casual  Eye Contact:  Fair  Speech:  Normal Rate  Volume:  Normal  Mood:  Anxious, overwhelmed  Affect:  Congruent  Thought Process:  Goal Directed and Descriptions  of Associations: Intact  Orientation:  Full (Time, Place, and Person)  Thought Content: Logical   Suicidal Thoughts:  No  Homicidal Thoughts:  No  Memory:  Immediate;   Fair Recent;   Fair Remote;   Fair  Judgement:  Fair  Insight:  Fair  Psychomotor Activity:  Normal  Concentration:  Concentration: Fair and Attention Span: Fair  Recall:  FiservFair  Fund of Knowledge: Fair  Language: Fair  Akathisia:  No  Handed:  Right  AIMS (if indicated): UTA  Assets:  Communication Skills Desire for Improvement Housing  ADL's:  Intact  Cognition: WNL  Sleep:  Restless due to vivid dreams   Screenings: GAD-7     [facility]  Total GAD-7 Score 18    PHQ2-9     [facility]  PHQ-2 Total Score 4 0  PHQ-9 Total Score 12 --       Assessment and Plan: Allison RangerCallie Nunez is a 34 year old Caucasian female, employed, single, lives in [place], has a history of bipolar disorder, ADHD, history of trauma was evaluated by telemedicine today.  She is biologically predisposed given her family history as well as history of trauma.  Patient with psychosocial stressors of recent health issues, relationship struggles, being a single mother, the pandemic, job related problems and financial stressors.  Patient is currently struggling with mood lability, anxiety and sleep issues.  Discussed plan as noted below.  Plan Bipolar disorder mixed-unstable Reduce Abilify to 5 mg p.o. daily Start carbamazepine extended release 100 mg p.o. twice daily Continue doxepin 10 to 20 mg p.o. nightly as needed  PTSD-improving Prozac 40 mg p.o. daily Prazosin 1 mg p.o. nightly.  Will consider increasing prazosin if she continues to have vivid dreams. For now she will continue to work with her therapist Ms. Guy FrancoLaren Oreilly.  Tobacco use  disorder-unstable Provided counseling. Currently on patches.  Alcohol use disorder in remission We will monitor closely  High risk medication use-we will order carbamazepine level, CMP, CBC with differential, lipid panel, hemoglobin A1c.  She will get it within a week prior to taking her morning dosage  of carbamazepine.  She will get it from her primary care office.  Follow-up in clinic in 3 to 4 weeks or sooner if needed.  I have spent atleast 30 minutes face to face by video with patient today. More than 50 % of the time was spent for preparing to see the patient ( e.g., review of test, records ), obtaining and to review and separately obtained history , ordering medications and test ,psychoeducation and supportive psychotherapy and care coordination,as well as documenting clinical information in electronic health record,interpreting results of test and communication of results This note was generated in part or whole with voice recognition software. Voice recognition is usually quite accurate but there are transcription errors that can and very often do occur. I apologize for any typographical errors that were not detected and corrected.      Jomarie Longs, MD 11/29/2019, 12:25 PM

## 2019-11-29 NOTE — Patient Instructions (Signed)
Carbamazepine extended-release tablets What is this medicine? CARBAMAZEPINE (kar ba MAZ e peen) is used to control seizures caused by certain types of epilepsy. This medicine is also used to treat nerve related pain. It is not for common aches and pains. This medicine may be used for other purposes; ask your health care provider or pharmacist if you have questions. COMMON BRAND NAME(S): Tegretol -XR What should I tell my health care provider before I take this medicine? They need to know if you have any of these conditions:  Asian ancestry  bone marrow disease  glaucoma  heart disease or irregular heartbeat  kidney disease  liver disease  low blood counts, like low white cell, platelet, or red cell counts  porphyria  psychotic disorders  suicidal thoughts, plans, or attempt; a previous suicide attempt by you or a family member  an unusual or allergic reaction to carbamazepine, tricyclic antidepressants, phenytoin, phenobarbital or other medicines, foods, dyes, or preservatives  pregnant or trying to get pregnant  breast-feeding How should I use this medicine? Take this medicine by mouth with a glass of water. Follow the directions on the prescription label. Swallow whole, do not cut, crush or chew. The tablets should be inspected for chips or cracks. Do not take any tablets that are damaged. Take this medicine with food. Take your doses at regular intervals. Do not take your medicine more often than directed. Do not stop taking this medicine except on the advice of your doctor or health care professional. A special MedGuide will be given to you by the pharmacist with each prescription and refill. Be sure to read this information carefully each time. Talk to your pediatrician regarding the use of this medicine in children. Special care may be needed. Overdosage: If you think you have taken too much of this medicine contact a poison control center or emergency room at once. NOTE:  This medicine is only for you. Do not share this medicine with others. What if I miss a dose? If you miss a dose, take it as soon as you can. If it is almost time for your next dose, take only that dose. Do not take double or extra doses. What may interact with this medicine? Do not take this medicine with any of the following medications:  certain medicines used to treat HIV infection or AIDS that are given in combination with cobicistat   delavirdine   MAOIs like Carbex, Eldepryl, Marplan, Nardil, and Parnate   nefazodone   oxcarbazepine This medicine may also interact with the following medications:   acetaminophen   acetazolamide   barbiturate medicines for inducing sleep or treating seizures, like phenobarbital   certain antibiotics like clarithromycin, erythromycin or troleandomycin   cimetidine   cyclosporine   danazol   dicumarol   doxycycline   female hormones, including estrogens and birth control pills   grapefruit juice   isoniazid, INH   levothyroxine and other thyroid hormones   lithium and other medicines to treat mood problems or psychotic disturbances   loratadine   medicines for angina or high blood pressure   medicines for cancer   medicines for depression or anxiety   medicines for sleep   medicines to treat fungal infections, like fluconazole, itraconazole or ketoconazole   medicines used to treat HIV infection or AIDS   methadone   niacinamide   praziquantel   propoxyphene   rifampin or rifabutin   seizure or epilepsy medicine   steroid medicines such as prednisone or cortisone     theophylline   tramadol   warfarin This list may not describe all possible interactions. Give your health care provider a list of all the medicines, herbs, non-prescription drugs, or dietary supplements you use. Also tell them if you smoke, drink alcohol, or use illegal drugs. Some items may interact with  your medicine. What should I watch for while using this medicine? Visit your doctor or health care provider for a regular check on your progress. Do not change brands or dosage forms of this medicine without discussing the change with your doctor or health care provider. If you are taking this medicine for epilepsy (seizures), do not stop taking it suddenly. This increases the risk of seizures. Wear a Medic Alert bracelet or necklace. Carry an identification card with information about your condition, medications, and doctor or health care provider. This medicine may cause serious skin reactions. They can happen weeks to months after starting the medicine. Contact your health care provider right away if you notice fevers or flu-like symptoms with a rash. The rash may be red or purple and then turn into blisters or peeling of the skin. Or, you might notice a red rash with swelling of the face, lips or lymph nodes in your neck or under your arms. You may get drowsy, dizzy, or have blurred vision. Do not drive, use machinery, or do anything that needs mental alertness until you know how this medicine affects you. To reduce dizzy or fainting spells, do not sit or stand up quickly, especially if you are an older patient. Alcohol can increase drowsiness and dizziness. Avoid alcoholic drinks. Birth control pills may not work properly while you are taking this medicine. Talk to your doctor about using an extra method of birth control. This medicine can make you more sensitive to the sun. Keep out of the sun. If you cannot avoid being in the sun, wear protective clothing and use sunscreen. Do not use sun lamps or tanning beds/booths. The coating on the tablets is not absorbed by the body and you may notice it in your stool. This is no cause for concern. The use of this medicine may increase the chance of suicidal thoughts or actions. Pay special attention to how you are responding while on this medicine. Any  worsening of mood, or thoughts of suicide or dying should be reported to your health care provider right away. Women who become pregnant while using this medicine may enroll in the [place] American Antiepileptic Drug Pregnancy Registry by calling 1-888-233-2334. This registry collects information about the safety of antiepileptic drug use during pregnancy. This medicine may cause a decrease in vitamin D and folic acid. You should make sure that you get enough vitamins while you are taking this medicine. Discuss the foods you eat and the vitamins you take with your health care provider. What side effects may I notice from receiving this medicine? Side effects that you should report to your doctor or health care professional as soon as possible:  allergic reactions like skin rash, itching or hives, swelling of the face, lips, or tongue  breathing problems  changes in vision  confusion  dark urine  fast or irregular heartbeat  fever or chills, sore throat  mouth ulcers  pain or difficulty passing urine  rash, fever, and swollen lymph nodes  redness, blistering, peeling or loosening of the skin, including inside the mouth  ringing in the ears  seizures  stomach pain  swollen joints or muscle/joint aches and pains  unusual bleeding   or bruising  unusually weak or tired  vomiting  worsening of mood, thoughts or actions of suicide or dying  yellowing of the eyes or skin Side effects that usually do not require medical attention (report to your doctor or health care professional if they continue or are bothersome):  clumsiness or unsteadiness  diarrhea or constipation  headache  increased sweating  nausea This list may not describe all possible side effects. Call your doctor for medical advice about side effects. You may report side effects to FDA at 1-800-FDA-1088. Where should I keep my medicine? Keep out of reach of children. Store at room temperature between 15 and  30 degrees C (59 and 86 degrees F). Keep container tightly closed. Protect from moisture. Throw away any unused medicine after the expiration date. NOTE: This sheet is a summary. It may not cover all possible information. If you have questions about this medicine, talk to your doctor, pharmacist, or health care provider.  2020 Elsevier/Gold Standard (2018-05-17 08:52:47)  

## 2019-12-11 ENCOUNTER — Encounter: Payer: Self-pay | Admitting: Licensed Clinical Social Worker

## 2019-12-11 ENCOUNTER — Ambulatory Visit (INDEPENDENT_AMBULATORY_CARE_PROVIDER_SITE_OTHER): Payer: Medicaid Other | Admitting: Licensed Clinical Social Worker

## 2019-12-11 ENCOUNTER — Other Ambulatory Visit: Payer: Self-pay

## 2019-12-11 DIAGNOSIS — F3162 Bipolar disorder, current episode mixed, moderate: Secondary | ICD-10-CM | POA: Diagnosis not present

## 2019-12-11 DIAGNOSIS — F4310 Post-traumatic stress disorder, unspecified: Secondary | ICD-10-CM | POA: Diagnosis not present

## 2019-12-11 DIAGNOSIS — F4010 Social phobia, unspecified: Secondary | ICD-10-CM

## 2019-12-11 NOTE — Progress Notes (Signed)
Patient Location: Home  Provider Location: Home Office   Virtual Visit via Video Note  I connected with Allison Nunez on 12/11/19 at  3:00 PM EDT by a video enabled telemedicine application and verified that I am speaking with the correct person using two identifiers.   I discussed the limitations of evaluation and management by telemedicine and the availability of in person appointments. The patient expressed understanding and agreed to proceed.  THERAPY PROGRESS NOTE  Session Time: 30 Minutes  Participation Level: Active  Behavioral Response: CasualAlertAnxious  Type of Therapy: Individual Therapy  Treatment Goals addressed: Anxiety and Coping  Interventions: CBT  Summary: Allison Nunez is a 34 y.o. female who presents with depression and anxiety sxs. Pt identified several current stressors related to finances, relationship with her daughter, and fears around social gatherings. Pt reported that she would like to prioritize focus for session on managing her PTSD sxs so that she can be alone or with her children outside the home. Pt reported she wants to gain more insight into how she developed these sxs and how to move forward. Pt identified ways she has tried to bolster her confidence and sense of safety.  Suicidal/Homicidal: No  Therapist Response: Therapist met with patient for follow up session. Therapist and patient discussed current stressors and attempts to cope. Therapist provided psychoeducation around exposure therapy and habit formation. Pt was receptive.  Plan: Return again in 2 weeks.  Diagnosis: Axis I: Bipolar, mixed, Post Traumatic Stress Disorder and Social Anxiety    Axis II: N/A   P O'Reilly, LCSW, LCAS 12/11/2019  

## 2019-12-25 ENCOUNTER — Other Ambulatory Visit: Payer: Self-pay

## 2019-12-25 ENCOUNTER — Encounter: Payer: Self-pay | Admitting: Licensed Clinical Social Worker

## 2019-12-25 ENCOUNTER — Ambulatory Visit (INDEPENDENT_AMBULATORY_CARE_PROVIDER_SITE_OTHER): Payer: Medicaid Other | Admitting: Licensed Clinical Social Worker

## 2019-12-25 DIAGNOSIS — F4310 Post-traumatic stress disorder, unspecified: Secondary | ICD-10-CM | POA: Diagnosis not present

## 2019-12-25 DIAGNOSIS — F4010 Social phobia, unspecified: Secondary | ICD-10-CM | POA: Diagnosis not present

## 2019-12-25 DIAGNOSIS — F3162 Bipolar disorder, current episode mixed, moderate: Secondary | ICD-10-CM
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
## 2019-12-31 ENCOUNTER — Other Ambulatory Visit: Payer: Self-pay

## 2019-12-31 ENCOUNTER — Telehealth (INDEPENDENT_AMBULATORY_CARE_PROVIDER_SITE_OTHER): Payer: Medicaid Other | Admitting: Psychiatry

## 2019-12-31 ENCOUNTER — Encounter: Payer: Self-pay | Admitting: Psychiatry

## 2019-12-31 DIAGNOSIS — F17200 Nicotine dependence, unspecified, uncomplicated: Secondary | ICD-10-CM | POA: Diagnosis not present

## 2019-12-31 DIAGNOSIS — F3162 Bipolar disorder, current episode mixed, moderate: Secondary | ICD-10-CM

## 2019-12-31 DIAGNOSIS — Z8659 Personal history of other mental and behavioral disorders: Secondary | ICD-10-CM

## 2019-12-31 DIAGNOSIS — F4310 Post-traumatic stress disorder, unspecified: Secondary | ICD-10-CM

## 2019-12-31 DIAGNOSIS — F1021 Alcohol dependence, in remission: Secondary | ICD-10-CM

## 2019-12-31 MED ORDER — PRAZOSIN HCL 1 MG PO CAPS: ORAL_CAPSULE | ORAL | 2 refills | Status: DC

## 2019-12-31 MED ORDER — DOXEPIN HCL 10 MG PO CAPS: 10.0000 mg | ORAL_CAPSULE | Freq: Every evening | ORAL | 2 refills | Status: DC | PRN

## 2019-12-31 MED ORDER — ARIPIPRAZOLE 10 MG PO TABS: 10.0000 mg | ORAL_TABLET | Freq: Every day | ORAL | 0 refills | Status: DC

## 2019-12-31 MED ORDER — FLUOXETINE HCL 40 MG PO CAPS: 40.0000 mg | ORAL_CAPSULE | Freq: Every day | ORAL | 2 refills | Status: DC

## 2019-12-31 NOTE — Progress Notes (Signed)
Virtual Visit via Video Note  I connected with Allison Nunez on 12/31/19 at  1:30 PM EDT by a video enabled telemedicine application and verified that I am speaking with the correct person using two identifiers.  Location Provider Location : [facility] Patient Location : Car  Participants: Patient , Provider    I discussed the limitations of evaluation and management by telemedicine and the availability of in person appointments. The patient expressed understanding and agreed to proceed.     I discussed the assessment and treatment plan with the patient. The patient was provided an opportunity to ask questions and all were answered. The patient agreed with the plan and demonstrated an understanding of the instructions.   The patient was advised to call back or seek an in-person evaluation if the symptoms worsen or if the condition fails to improve as anticipated.   BH MD OP Progress Note  12/31/2019 2:00 PM Allison Nunez  MRN:  [id number]  Chief Complaint:  Chief Complaint    Follow-up     HPI: Allison Nunez is a 34 year old Caucasian female, single, lives in [place], employed, has a history of bipolar disorder, PTSD, ADHD, tobacco use disorder, alcohol use disorder in remission was evaluated by telemedicine today.  Patient today reports she did not start the carbamazepine as discussed last visit.  She reports when she read through the list of side effects she got scared and also did not want to get levels done and hence decided to not start that medication.  She reports she went back on the Abilify and currently takes at 10 mg.  She currently wants to stay on the Abilify since she feels the Abilify does help her and she does not feel the same way as she felt the last time she spoke to Clinical research associatewriter.  She does not have any side effects to Abilify except for weight gain.  She however does not know if the Abilify contributed to her weight gain or not.  She is interested in medications to help with  weight gain due to Abilify.  Patient reports sleep is okay.  She reports she sometimes needs doxepin 20 mg to sleep through the night.  She however has been taking the second dosage when she wakes up in the middle of the night and that does make her sluggish during the day.  Patient denies any suicidality, homicidality or perceptual disturbances.  She is currently trying to find a therapist who is specialized in trauma.  She is working with her current therapist to be referred.  Patient is currently struggling with upper respiratory infection and is currently on antibiotic as well as prednisone.  Patient denies any other concerns today.  Visit Diagnosis:    ICD-10-CM   1. Bipolar 1 disorder, mixed, moderate (HCC)  F31.62 ARIPiprazole (ABILIFY) 10 MG tablet    FLUoxetine (PROZAC) 40 MG capsule  2. PTSD (post-traumatic stress disorder)  F43.10 doxepin (SINEQUAN) 10 MG capsule    FLUoxetine (PROZAC) 40 MG capsule    prazosin (MINIPRESS) 1 MG capsule  3. Tobacco use disorder  F17.200   4. History of ADHD  Z86.59   5. Alcohol use disorder, moderate, in sustained remission (HCC)  F10.21     Past Psychiatric History: I have reviewed past psychiatric history from my progress note on 12/25/2018.  Past trials of Depakote-hair loss, Seroquel, Prozac, Wellbutrin, risperidone, Adderall, Ritalin, Geodon  Past Medical History:  Past Medical History:  Diagnosis Date  . ADHD (attention deficit hyperactivity disorder)   .  Anxiety   . BV (bacterial vaginosis)   . Cervical dysplasia   . Depression   . Hypothyroidism     Past Surgical History:  Procedure Laterality Date  . TUBAL LIGATION      Family Psychiatric History: I have reviewed family psychiatric history from my progress note on 12/25/2018  Family History:  Family History  Problem Relation Age of Onset  . Anxiety disorder Sister   . Depression Sister   . Alcohol abuse Brother   . Drug abuse Brother     Social History: Reviewed  social history from my progress note on 12/25/2018 Social History   Socioeconomic History  . Marital status: Single    Spouse name: Not on file  . Number of children: 5  . Years of education: Not on file  . Highest education level: High school graduate  Occupational History  . Not on file  Tobacco Use  . Smoking status: Current Every Day Smoker    Packs/day: 1.00    Years: 16.00    Pack years: 16.00    Types: Cigarettes  . Smokeless tobacco: Never Used  . Tobacco comment: 7 Cigarettes per day- reported 12/31/2019  Vaping Use  . Vaping Use: Former  Substance and Sexual Activity  . Alcohol use: Not Currently  . Drug use: Never  . Sexual activity: Yes    Birth control/protection: Condom  Other Topics Concern  . Not on file  Social History Narrative  . Not on file   Social Determinants of Health   Financial Resource Strain:   . Difficulty of Paying Living Expenses: Not on file  Food Insecurity:   . Worried About Programme researcher, broadcasting/film/video in the Last Year: Not on file  . Ran Out of Food in the Last Year: Not on file  Transportation Needs:   . Lack of Transportation (Medical): Not on file  . Lack of Transportation (Non-Medical): Not on file  Physical Activity:   . Days of Exercise per Week: Not on file  . Minutes of Exercise per Session: Not on file  Stress:   . Feeling of Stress : Not on file  Social Connections:   . Frequency of Communication with Friends and Family: Not on file  . Frequency of Social Gatherings with Friends and Family: Not on file  . Attends Religious Services: Not on file  . Active Member of Clubs or Organizations: Not on file  . Attends Banker Meetings: Not on file  . Marital Status: Not on file    Allergies: No Known Allergies  Metabolic Disorder Labs: No results found for: HGBA1C, MPG No results found for: PROLACTIN No results found for: CHOL, TRIG, HDL, CHOLHDL, VLDL, LDLCALC No results found for: TSH  Therapeutic Level  Labs: No results found for: LITHIUM No results found for: VALPROATE No components found for:  CBMZ  Current Medications: Current Outpatient Medications  Medication Sig Dispense Refill  . ARIPiprazole (ABILIFY) 10 MG tablet Take 1 tablet (10 mg total) by mouth daily. 90 tablet 0  . cephALEXin (KEFLEX) 500 MG capsule Take 500 mg by mouth 3 (three) times daily.    Marland Kitchen doxepin (SINEQUAN) 10 MG capsule Take 1-2 capsules (10-20 mg total) by mouth at bedtime as needed. SLEEP 60 capsule 2  . fluconazole (DIFLUCAN) 150 MG tablet Take 1 tablet by mouth single dose  for yeast infection. May repeat in 3 days if symptoms persist    . FLUoxetine (PROZAC) 40 MG capsule Take 1 capsule (40  mg total) by mouth daily. 30 capsule 2  . HYDROcodone-acetaminophen (NORCO/VICODIN) 5-325 MG tablet Take 1 tablet by mouth every 4 (four) hours as needed.    . hydrOXYzine (ATARAX/VISTARIL) 25 MG tablet Take 1 tablet (25 mg total) by mouth 3 (three) times daily as needed for anxiety. 90 tablet 2  . nicotine (NICODERM CQ - DOSED IN MG/24 HOURS) 14 mg/24hr patch Place 1 patch (14 mg total) onto the skin daily. 28 patch 0  . nicotine (NICODERM CQ - DOSED IN MG/24 HR) 7 mg/24hr patch     . prazosin (MINIPRESS) 1 MG capsule TAKE 1 CAPSULE(1 MG) BY MOUTH AT BEDTIME FOR NIGHTMARES 30 capsule 2  . tinidazole (TINDAMAX) 500 MG tablet Take 500 mg by mouth 2 (two) times daily.     No current facility-administered medications for this visit.     Musculoskeletal: Strength & Muscle Tone: UTA Gait & Station: normal Patient leans: N/A  Psychiatric Specialty Exam: Review of Systems  HENT: Positive for congestion and sore throat.   Psychiatric/Behavioral:       Mood swings  All other systems reviewed and are negative.   There were no vitals taken for this visit.There is no height or weight on file to calculate BMI.  General Appearance: Casual  Eye Contact:  Fair  Speech:  Clear and Coherent  Volume:  Normal  Mood:  Mood swings  improving  Affect:  Congruent  Thought Process:  Goal Directed and Descriptions of Associations: Intact  Orientation:  Full (Time, Place, and Person)  Thought Content: Logical   Suicidal Thoughts:  No  Homicidal Thoughts:  No  Memory:  Immediate;   Fair Recent;   Fair Remote;   Fair  Judgement:  Fair  Insight:  Fair  Psychomotor Activity:  Normal  Concentration:  Concentration: Fair and Attention Span: Fair  Recall:  [employer] of Knowledge: Fair  Language: Fair  Akathisia:  No  Handed:  Right  AIMS (if indicated): UTA  Assets:  Communication Skills Desire for Improvement Housing Social Support  ADL's:  Intact  Cognition: WNL  Sleep:  Fair   Screenings: GAD-7     [facility]/24/[facility]  Total GAD-7 Score 18    PHQ2-9     Office Visit from 04/24/2018 in [facility] Office Visit from 01/09/2018 in [facility]ious [facility]  PHQ-2 Total Score 4 0  PHQ-9 Total Score 12 --       Assessment and Plan: Adisen Bennion is a 34 year old Caucasian female, employed, single, lives in [place], has a history of bipolar disorder, ADHD, history of trauma was evaluated by telemedicine today.  Patient is biologically predisposed given her family history as well as history of trauma.  Patient with psychosocial stressors of recent health issues, relationship struggles, being a single mother, pandemic, job related problems and financial stressors.  Patient is currently noncompliant with medication recommendation and does report continued problems due to her history of trauma.  She however is not interested in more medication changes and would like to continue psychotherapy.  She also struggles with weight gain and is interested in medication management.  Plan as noted below.  Plan Bipolar disorder mixed-some progress Restart Abilify 10 mg p.o. daily.  Patient reports she went back on the Abilify  10 mg since her last visit with Clinical research associate. Discontinue carbamazepine for noncompliance Continue doxepin 20 mg p.o. nightly as needed for sleep.  Encouraged patient  to take the 20 mg at bedtime and not to divide the dosage since that is making her drowsy and groggy throughout the day.  PTSD-some progress Prozac 40 mg p.o. daily Prazosin 1 mg p.o. nightly. Continue CBT with Ms. Juanito Doom. She is currently working with her therapist to be referred to a therapist who is specialized in trauma. Provided her information for Ms. Bubba Camp in [place] [facility].  Tobacco use disorder-improving She is currently cutting back.   Alcohol use disorder in remission We will monitor closely  Discussed adding metformin, Topamax for weight gain due to Abilify.  Patient however reports she is not sure if the Abilify is causing weight gain and hence will talk to her primary care provider about referral to a weight loss clinic.  Patient is currently not interested in making any changes with her medications and would like to continue the same.  Encourage patient to have more intensive psychotherapy sessions.  Follow-up in clinic in 3 to 4 weeks or sooner if needed.  I have spent atleast 20 minutes face to face by video with patient today. More than 50 % of the time was spent for preparing to see the patient ( e.g., review of test, records ), ordering medications and test ,psychoeducation and supportive psychotherapy and care coordination,as well as documenting clinical information in electronic health record. This note was generated in part or whole with voice recognition software. Voice recognition is usually quite accurate but there are transcription errors that can and very often do occur. I apologize for any typographical errors that were not detected and corrected.        Jomarie Longs, MD 12/31/2019, 2:00 PM

## 2020-01-07 ENCOUNTER — Ambulatory Visit: Payer: Medicaid Other | Admitting: Licensed Clinical Social Worker

## 2020-01-07 ENCOUNTER — Telehealth: Payer: Medicaid Other | Admitting: Licensed Clinical Social Worker

## 2020-01-07 ENCOUNTER — Other Ambulatory Visit: Payer: Self-pay

## 2020-01-07 NOTE — Telephone Encounter (Signed)
Therapist attempted to contact patient for today's appointment via video invite link. Pt responded s/ she was feeling "sick and drained" from an "upper respiratory infection". Therapist offered to reschedule as patient was trying to get some rest. Pt agreed to reschedule for 12/2 at 2pm. Pt verified she received reading material for homework assignment and would like additional reading material to review between now and next session.

## 2020-01-30 ENCOUNTER — Encounter: Payer: Self-pay | Admitting: Licensed Clinical Social Worker

## 2020-01-30 ENCOUNTER — Ambulatory Visit (INDEPENDENT_AMBULATORY_CARE_PROVIDER_SITE_OTHER): Payer: Medicaid Other | Admitting: Licensed Clinical Social Worker

## 2020-01-30 ENCOUNTER — Other Ambulatory Visit: Payer: Self-pay | Admitting: Oncology

## 2020-01-30 ENCOUNTER — Other Ambulatory Visit: Payer: Self-pay

## 2020-01-30 DIAGNOSIS — D72829 Elevated white blood cell count, unspecified: Secondary | ICD-10-CM

## 2020-01-30 DIAGNOSIS — F4010 Social phobia, unspecified: Secondary | ICD-10-CM

## 2020-01-30 DIAGNOSIS — F4310 Post-traumatic stress disorder, unspecified: Secondary | ICD-10-CM | POA: Diagnosis not present

## 2020-01-30 DIAGNOSIS — F3162 Bipolar disorder, current episode mixed, moderate: Secondary | ICD-10-CM

## 2020-01-30 NOTE — Progress Notes (Signed)
[facility]  [address] [place],  [place]  [address] [phone]  Clinic Day:  02/02/2020  Referring physician:  Evie Lacks, NP  HISTORY OF PRESENT ILLNESS:  The patient is a 34 y.o. female  who I was asked to consult upon for leukocytosis.   Recent labs at her primary care office showed an elevated white count of 18.2.  Furthermore, her hemoglobin was borderline elevated at 16.  According to the patient, she has had recurrent infections with bacterial vaginosis.  He has also had recently had a left ear infection. For this, she was given both antibiotics and prednisone.  She denies having any swollen joints which could have precipitated her leukocytosis.  Of note, she has smoked at least a pack of cigarettes daily for 20 years.  She denies having B symptoms, such as fevers, drenching night sweats or unexplained weight loss, which concern her for her leukocytosis being due to an underlying hematologic malignancy.    PAST MEDICAL HISTORY:   Past Medical History:  Diagnosis Date  . ADHD (attention deficit hyperactivity disorder)   . Allergy   . Anxiety   . Anxiety   . Bipolar 1 disorder (HCC)   . BV (bacterial vaginosis)   . Cervical dysplasia   . Depression   . Hypothyroidism   . PTSD (post-traumatic stress disorder)   Bipolar affective disorder, post-traumatic stress disorder  PAST SURGICAL HISTORY:   Past Surgical History:  Procedure Laterality Date  . TUBAL LIGATION      CURRENT MEDICATIONS:   Current Outpatient Medications  Medication Sig Dispense Refill  . ARIPiprazole (ABILIFY) 10 MG tablet Take 1 tablet (10 mg total) by mouth daily. 90 tablet 0  . cephALEXin (KEFLEX) 500 MG capsule Take 500 mg by mouth 3 (three) times daily.    Marland Kitchen doxepin (SINEQUAN) 10 MG capsule Take 1-2 capsules (10-20 mg total) by mouth at bedtime as needed. SLEEP 60 capsule 2  . fluconazole (DIFLUCAN) 150 MG tablet Take 1 tablet by mouth single dose  for  yeast infection. May repeat in 3 days if symptoms persist    . FLUoxetine (PROZAC) 40 MG capsule Take 1 capsule (40 mg total) by mouth daily. 30 capsule 2  . HYDROcodone-acetaminophen (NORCO/VICODIN) 5-325 MG tablet Take 1 tablet by mouth every 4 (four) hours as needed.    . hydrOXYzine (ATARAX/VISTARIL) 25 MG tablet Take 1 tablet (25 mg total) by mouth 3 (three) times daily as needed for anxiety. 90 tablet 2  . nicotine (NICODERM CQ - DOSED IN MG/24 HOURS) 14 mg/24hr patch Place 1 patch (14 mg total) onto the skin daily. 28 patch 0  . nicotine (NICODERM CQ - DOSED IN MG/24 HR) 7 mg/24hr patch     . prazosin (MINIPRESS) 1 MG capsule TAKE 1 CAPSULE(1 MG) BY MOUTH AT BEDTIME FOR NIGHTMARES 30 capsule 2  . tinidazole (TINDAMAX) 500 MG tablet Take 500 mg by mouth 2 (two) times daily.     No current facility-administered medications for this visit.    ALLERGIES:  No Known Allergies  FAMILY HISTORY:   Family History  Problem Relation Age of Onset  . Breast cancer Mother   . COPD Mother   . Leukemia Father   . Bladder Cancer Father   . Anxiety disorder Sister   . Depression Sister   . Alcohol abuse Brother   . Drug abuse Brother   Her father has chronic lymphocytic leukemia and bladder cancer.  Her mother has breast  cancer.  A maternal aunt had skin cancer.  SOCIAL HISTORY:  The patient was born in [place].  She lives in [place].  She is single, with 5 children.  She works in Pharmacologist.  She smokes a pack of cigarettes daily x 20 years.  She is a previous alcoholic, but has been sober for 8 years.  REVIEW OF SYSTEMS:  Review of Systems  Constitutional: Positive for fatigue. Negative for fever.  HENT:   Negative for hearing loss and sore throat.   Eyes: Negative for eye problems.  Respiratory: Positive for cough. Negative for chest tightness and hemoptysis.   Cardiovascular: Negative for chest pain and palpitations.  Gastrointestinal: Positive for diarrhea. Negative  for abdominal distention, abdominal pain, blood in stool, constipation, nausea and vomiting.  Endocrine: Negative for hot flashes.  Genitourinary: Negative for difficulty urinating, dysuria, frequency, hematuria and nocturia.   Musculoskeletal: Positive for arthralgias. Negative for back pain, gait problem and myalgias.  Skin: Negative.  Negative for itching and rash.  Neurological: Positive for headaches. Negative for dizziness, extremity weakness, gait problem, light-headedness and numbness.  Hematological: Negative.   Psychiatric/Behavioral: Positive for depression. Negative for suicidal ideas. The patient is nervous/anxious.      PHYSICAL EXAM:  Blood pressure (!) 126/98, pulse (!) 101, temperature 99.1 F (37.3 C), resp. rate 16, height 5\' 5"  (1.651 m), weight 172 lb 9.6 oz (78.3 kg), last menstrual period 01/01/2020, SpO2 93 %. Wt Readings from Last 3 Encounters:  01/31/20 172 lb 9.6 oz (78.3 kg)  04/24/18 151 lb 12.8 oz (68.9 kg)  03/05/18 149 lb (67.6 kg)   Body mass index is 28.72 kg/m. Performance status (ECOG): 0 Physical Exam Constitutional:      Appearance: Normal appearance. She is not ill-appearing.  HENT:     Mouth/Throat:     Mouth: Mucous membranes are moist.     Pharynx: Oropharynx is clear. No oropharyngeal exudate or posterior oropharyngeal erythema.  Cardiovascular:     Rate and Rhythm: Normal rate and regular rhythm.     Heart sounds: No murmur heard.  No friction rub. No gallop.   Pulmonary:     Effort: Pulmonary effort is normal. No respiratory distress.     Breath sounds: Normal breath sounds. No wheezing, rhonchi or rales.  Abdominal:     General: Bowel sounds are normal. There is no distension.     Palpations: Abdomen is soft. There is no mass.     Tenderness: There is no abdominal tenderness.  Musculoskeletal:        General: No swelling.     Right lower leg: No edema.     Left lower leg: No edema.  Lymphadenopathy:     Cervical: No cervical  adenopathy.     Upper Body:     Right upper body: No supraclavicular or axillary adenopathy.     Left upper body: No supraclavicular or axillary adenopathy.     Lower Body: No right inguinal adenopathy. No left inguinal adenopathy.  Skin:    General: Skin is warm.     Coloration: Skin is not jaundiced.     Findings: No lesion or rash.  Neurological:     General: No focal deficit present.     Mental Status: She is alert and oriented to person, place, and time. Mental status is at baseline.     Cranial Nerves: Cranial nerves are intact.  Psychiatric:        Mood and Affect: Mood normal.  Behavior: Behavior normal.        Thought Content: Thought content normal.    LABS:   CBC Latest Ref Rng & Units 01/31/2020 04/24/2018  WBC - 14.6 14.6(H)  Hemoglobin 12.0 - 16.0 14.7 13.8  Hematocrit 36 - 46 44 42.1  Platelets 150 - 399 232 202    ASSESSMENT & PLAN:  A 34 y.o. female who I was asked to consult upon for leukocytosis.  Based upon her labs today, her white count remains mildly elevated, but is stable. This patient has labs dating back to 2014 in the hospital system which have essentially shown that her leukocytosis is chronic in nature.  I do believe a huge component of her leukocytosis is due to her chronic cigarette smoking.  Her infections are likely contributing to her leukocytosis, but the fact that her white count has been elevated for years suggests her smoking is the major culprit.  She was strongly encouraged to abstain from cigarette smoking as to prevent aerodigestive malignancies from developing in the future.  Overall, I do not get the sense an ominous issue is behind her leukocytosis.  Per her request, I will see her back in 6 months to reassess her leukocytosis.  The patient understands all the plans discussed today and is in agreement with them.  I do appreciate Evie Lacks, NP, for his new consult.   Dasja Brase Kirby Funk, MD

## 2020-01-30 NOTE — Progress Notes (Signed)
Virtual Visit via Video Note  I connected with Allison Nunez on 01/30/20 at  2:00 PM EST by a video enabled telemedicine application and verified that I am speaking with the correct person using two identifiers.  Participating Parties Patient Provider  Location: Patient: Home Provider: Home Office   I discussed the limitations of evaluation and management by telemedicine and the availability of in person appointments. The patient expressed understanding and agreed to proceed.  THERAPY PROGRESS NOTE  Session Time: 24 Minutes  Participation Level: Active  Behavioral Response: Casual and Well GroomedAlertAnxious and Dysphoric  Type of Therapy: Individual Therapy  Treatment Goals addressed: Anger, Anxiety and Coping  Interventions: CBT  Summary: Allison Nunez is a 34 y.o. female who presents with depression and anxiety sxs. Pt reported worsening anxiety and some dissociative sxs since last session. Pt identified new health related stressors in addition to parenting and financial struggles that patient has been managing for some time. Pt reported a lack of motivation and feeling tired and exhausted. Pt described "my body and brain are in a war zone trying to find its way out". Pt identified a life she wants for herself and recognizes needs for change, however is feeling "stuck and can do anything about it". Pt reported she has not followed up on reading material on PTSD, but is hoping to get "the Christmas spirit" and did find some relief today while kids were in school to put up decorations.  Suicidal/Homicidal: No  Therapist Response: Therapist met with patient for follow up session. Therapist and patient explored thoughts, feelings and reactions to stressors. Therapist validated patient feelings/concerns. Therapist encouraged patient to identify ways she can care for self and own needs and discussed how "running on an empty cup" is not sustainable. Pt was receptive.  Plan: Return  again in 3 weeks.  Diagnosis: Axis I: Bipolar, mixed, Post Traumatic Stress Disorder and Social Anxiety    Axis II: N/A  Josephine Igo, LCSW, LCAS 01/30/2020

## 2020-01-31 ENCOUNTER — Telehealth: Payer: Self-pay | Admitting: Oncology

## 2020-01-31 ENCOUNTER — Other Ambulatory Visit: Payer: Self-pay | Admitting: Hematology and Oncology

## 2020-01-31 ENCOUNTER — Other Ambulatory Visit: Payer: Self-pay | Admitting: Oncology

## 2020-01-31 ENCOUNTER — Inpatient Hospital Stay: Payer: Medicaid Other | Attending: Oncology

## 2020-01-31 ENCOUNTER — Inpatient Hospital Stay (INDEPENDENT_AMBULATORY_CARE_PROVIDER_SITE_OTHER): Payer: Medicaid Other | Admitting: Oncology

## 2020-01-31 ENCOUNTER — Encounter: Payer: Self-pay | Admitting: Oncology

## 2020-01-31 VITALS — BP 126/98 | HR 101 | Temp 99.1°F | Resp 16 | Ht 65.0 in | Wt 172.6 lb

## 2020-01-31 DIAGNOSIS — D72829 Elevated white blood cell count, unspecified: Secondary | ICD-10-CM

## 2020-01-31 LAB — CBC AND DIFFERENTIAL
HCT: 44 (ref 36–46)
Hemoglobin: 14.7 (ref 12.0–16.0)
Neutrophils Absolute: 9.2
Platelets: 232 (ref 150–399)
WBC: 14.6

## 2020-01-31 LAB — CBC
MCV: 91 (ref 76–111)
RBC: 4.81 (ref 3.87–5.11)

## 2020-01-31 NOTE — Telephone Encounter (Signed)
Per 12/3 LOS, scheduled patient's June 2022 Appt's.  Gave patient Appt Summary

## 2020-02-03 ENCOUNTER — Telehealth (INDEPENDENT_AMBULATORY_CARE_PROVIDER_SITE_OTHER): Payer: Medicaid Other | Admitting: Psychiatry

## 2020-02-03 ENCOUNTER — Encounter: Payer: Self-pay | Admitting: Psychiatry

## 2020-02-03 ENCOUNTER — Other Ambulatory Visit: Payer: Self-pay

## 2020-02-03 DIAGNOSIS — Z8659 Personal history of other mental and behavioral disorders: Secondary | ICD-10-CM | POA: Diagnosis not present

## 2020-02-03 DIAGNOSIS — F3162 Bipolar disorder, current episode mixed, moderate: Secondary | ICD-10-CM | POA: Diagnosis not present

## 2020-02-03 DIAGNOSIS — F1021 Alcohol dependence, in remission: Secondary | ICD-10-CM

## 2020-02-03 DIAGNOSIS — F4310 Post-traumatic stress disorder, unspecified: Secondary | ICD-10-CM | POA: Diagnosis not present

## 2020-02-03 DIAGNOSIS — F17200 Nicotine dependence, unspecified, uncomplicated: Secondary | ICD-10-CM

## 2020-02-03 MED ORDER — LAMOTRIGINE 25 MG PO TABS: ORAL_TABLET | ORAL | 0 refills | Status: DC

## 2020-02-03 NOTE — Patient Instructions (Signed)
Stevens-Johnson Syndrome Stevens-Johnson syndrome is a rare disorder of the mucous membranes and skin. The syndrome tends to progress through several stages:  The mucous membranes become inflamed.  The top layer of skin dies and starts to shed. The more skin that dies, the more serious the disorder becomes.  The body loses fluids quickly.  The body loses its ability to keep germs out. This condition requires immediate treatment in the hospital to prevent complications such as:  Too much fluid loss.  Blood infection.  Eye damage.  Skin damage and infection.  Vision loss, if the eyes are affected.  Damage to the lungs, heart, kidneys, or liver. What are the causes? The most common cause of this condition is an allergic reaction to a medicine. Medicines that are known to cause this condition include:  Antibiotic medicines.  Antiseizure medicines.  Medicine that is used to treat gout.  NSAIDs. This condition can also be caused by an infection. In some cases, the cause may not be known. What increases the risk? You are more likely to develop this condition if you have:  A variation in the HLA gene. This variation may be passed down through families (inherited).  A family history of Stevens-Johnson syndrome.  Cancer, or you are having cancer treatment.  A weak body defense system (immune system).  Systemic lupus erythematosus. This condition is more likely to develop in people of Asian descent. What are the signs or symptoms? This condition often begins with several days of flu-like symptoms. Symptoms of this condition include:  Fever.  Sore throat.  Fatigue.  Headache.  Muscle aches.  Dry cough.  Burning feeling in the eyes. A painful red or purple rash may develop on the face, trunk, palms, or soles, and spread to other parts of the body. The rash creates blisters and open sores on the skin. If the mucous membranes are affected, the rash may be in  the:  Mouth.  Nose.  Eyes.  Genitals.  Digestive tract.  Urinary tract. Other signs and symptoms include:  Shedding of the skin or mucous membranes.  Swelling of the tongue and face.  Itchy, red, swollen areas of skin (hives).  Redness, sensitivity to light, and dryness in the eyes.  Pain in the mouth and throat.  Pain when urinating.  Pain when swallowing. How is this diagnosed? This condition is diagnosed based on:  A physical exam.  Tests, such as: ? A biopsy. This involves removing a sample of skin or eye tissue to be looked at under a microscope. ? Blood tests. ? Imaging tests. If you are having any eye symptoms, you may need to be seen by an eye specialist (ophthalmologist). How is this treated? This condition may be treated by:  Stopping medicines that may be causing symptoms.  Getting fluids and nourishment through an IV or through a tube that is passed through your nose and into your stomach (nasogastric tube or NG tube).  Gently removing dead skin and putting a moist bandage (dressing) with medicines on those areas.  Applying eye drops or having eye surgery.  Using a mouthwash that numbs the mouth and throat to help with swallowing.  Taking medicines to: ? Help you relax (sedatives). ? Control your pain. ? Fight infection (antibiotics). ? Stop skin swelling and itching. Follow these instructions at home: Medicines  Take over-the-counter and prescription medicines only as told by your health care provider.  If you were prescribed an antibiotic medicine, take it as told by your  health care provider. Do not stop using the antibiotic even if you start to feel better.  If a medicine triggered your condition, talk with your health care provider before you take the medicine again. Do not take it if your health care provider tells you not to.  Do not start taking any new medicines before you ask your health care provider if they are safe for  you. 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 If the condition was caused by a medicine, always tell your health care providers which medicine caused it.  Follow instructions from your health care provider about: ? How to clean and take care of your skin. ? When to change and remove dressings. ? How to protect your skin from the sun.  Wear a medical bracelet or necklace that says that you had this condition and tells the cause of it.  Ask your health care provider if you should be tested for the HLA gene.  Keep all follow-up visits as told by your health care provider. This is important. Contact a health care provider if:  You have trouble managing complications of the condition. Get help right away if:  You have flu-like symptoms after you have an infection or after you start a new medicine.  You develop symptoms on your skin or mucous membranes again. These symptoms may represent a serious problem that is an emergency. Do not wait to see if the symptoms will go away. Get medical help right away. Call your local emergency services (911 in the [place] Do not drive yourself to the hospital. Summary  Stevens-Johnson syndrome is a disorder of the mucous membranes and skin.  This condition requires immediate treatment in the hospital to prevent complications.  If a medicine triggered your condition, talk with your health care provider before you take the medicine again. Do not take it if your health care provider tells you not to.  Wear a medical bracelet or necklace that says that you had this condition and tells the cause of it.  Get help right away if you have flu-like symptoms after you have an infection or after you start a new medicine. This information is not intended to replace advice given to you by your health care provider. Make sure you discuss any questions you have with your health care provider. Document  Revised: 01/02/2018 Document Reviewed: 01/02/2018 Elsevier Patient Education  [place]. Lamotrigine tablets What is this medicine? LAMOTRIGINE (la MOE Hendricks Limes) is used to control seizures in adults and children with epilepsy and Lennox-Gastaut syndrome. It is also used in adults to treat bipolar disorder. This medicine may be used for other purposes; ask your health care provider or pharmacist if you have questions. COMMON BRAND NAME(S): Lamictal, Subvenite What should I tell my health care provider before I take this medicine? They need to know if you have any of these conditions:  aseptic meningitis during prior use of lamotrigine  depression  folate deficiency  kidney disease  liver disease  suicidal thoughts, plans, or attempt; a previous suicide attempt by you or a family member  an unusual or allergic reaction to lamotrigine or other seizure medications, other medicines, foods, dyes, or preservatives  pregnant or trying to get pregnant  breast-feeding How should I use this medicine? Take this medicine by mouth with a glass of water. Follow the directions on the prescription label. Do not chew these tablets. If this medicine upsets  your stomach, take it with food or milk. Take your doses at regular intervals. Do not take your medicine more often than directed. A special MedGuide will be given to you by the pharmacist with each new prescription and refill. Be sure to read this information carefully each time. Talk to your pediatrician regarding the use of this medicine in children. While this drug may be prescribed for children as young as 2 years for selected conditions, precautions do apply. Overdosage: If you think you have taken too much of this medicine contact a poison control center or emergency room at once. NOTE: This medicine is only for you. Do not share this medicine with others. What if I miss a dose? If you miss a dose, take it as soon as you can. If it  is almost time for your next dose, take only that dose. Do not take double or extra doses. What may interact with this medicine?  atazanavir  carbamazepine  female hormones, including contraceptive or birth control pills  lopinavir  methotrexate  phenobarbital  phenytoin  primidone  pyrimethamine  rifampin  ritonavir  trimethoprim  valproic acid This list may not describe all possible interactions. Give your health care provider a list of all the medicines, herbs, non-prescription drugs, or dietary supplements you use. Also tell them if you smoke, drink alcohol, or use illegal drugs. Some items may interact with your medicine. What should I watch for while using this medicine? Visit your doctor or health care provider for regular checks on your progress. If you take this medicine for seizures, wear a Medic Alert bracelet or necklace. Carry an identification card with information about your condition, medicines, and doctor or health care provider. It is important to take this medicine exactly as directed. When first starting treatment, your dose will need to be adjusted slowly. It may take weeks or months before your dose is stable. You should contact your doctor or health care provider if your seizures get worse or if you have any new types of seizures. Do not stop taking this medicine unless instructed by your doctor or health care provider. Stopping your medicine suddenly can increase your seizures or their severity. This medicine may cause serious skin reactions. They can happen weeks to months after starting the medicine. Contact your health care provider right away if you notice fevers or flu-like symptoms with a rash. The rash may be red or purple and then turn into blisters or peeling of the skin. Or, you might notice a red rash with swelling of the face, lips or lymph nodes in your neck or under your arms. You may get drowsy, dizzy, or have blurred vision. Do not drive, use  machinery, or do anything that needs mental alertness until you know how this medicine affects you. To reduce dizzy or fainting spells, do not sit or stand up quickly, especially if you are an older patient. Alcohol can increase drowsiness and dizziness. Avoid alcoholic drinks. If you are taking this medicine for bipolar disorder, it is important to report any changes in your mood to your doctor or health care provider. If your condition gets worse, you get mentally depressed, feel very hyperactive or manic, have difficulty sleeping, or have thoughts of hurting yourself or committing suicide, you need to get help from your health care provider right away. If you are a caregiver for someone taking this medicine for bipolar disorder, you should also report these behavioral changes right away. The use of this medicine may  increase the chance of suicidal thoughts or actions. Pay special attention to how you are responding while on this medicine. Your mouth may get dry. Chewing sugarless gum or sucking hard candy, and drinking plenty of water may help. Contact your doctor if the problem does not go away or is severe. Women who become pregnant while using this medicine may enroll in the [place] Pregnancy Registry by calling [phone]. This registry collects information about the safety of antiepileptic drug use during pregnancy. This medicine may cause a decrease in folic acid. You should make sure that you get enough folic acid while you are taking this medicine. Discuss the foods you eat and the vitamins you take with your health care provider. What side effects may I notice from receiving this medicine? Side effects that you should report to your doctor or health care professional as soon as possible:  allergic reactions like skin rash, itching or hives, swelling of the face, lips, or tongue  changes in vision  depressed mood  elevated mood, decreased need for sleep, racing  thoughts, impulsive behavior  loss of balance or coordination  mouth sores  rash, fever, and swollen lymph nodes  redness, blistering, peeling or loosening of the skin, including inside the mouth  right upper belly pain  seizures  severe muscle pain  signs and symptoms of aseptic meningitis such as stiff neck and sensitivity to light, headache, drowsiness, fever, nausea, vomiting, rash  signs of infection - fever or chills, cough, sore throat, pain or difficulty passing urine  suicidal thoughts or other mood changes  swollen lymph nodes  trouble walking  unusual bruising or bleeding  unusually weak or tired  yellowing of the eyes or skin Side effects that usually do not require medical attention (report to your doctor or health care professional if they continue or are bothersome):  diarrhea  dizziness  dry mouth  stuffy nose  tiredness  tremors  trouble sleeping This list may not describe all possible side effects. Call your doctor for medical advice about side effects. You may report side effects to FDA at 1-800-FDA-1088. Where should I keep my medicine? Keep out of reach of children. Store at room temperature between 15 and 30 degrees C (59 and 86 degrees F). Throw away any unused medicine after the expiration date. NOTE: This sheet is a summary. It may not cover all possible information. If you have questions about this medicine, talk to your doctor, pharmacist, or health care provider.  2020 Elsevier/Gold Standard (2018-05-18 15:03:40)

## 2020-02-03 NOTE — Progress Notes (Signed)
Virtual Visit via Telephone Note  I connected with Allison Nunez on 02/03/20 at  3:30 PM EST by telephone and verified that I am speaking with the correct person using two identifiers.  Location Provider Location : [facility] Patient Location : Home  Participants: Patient , Provider    I discussed the limitations, risks, security and privacy concerns of performing an evaluation and management service by telephone and the availability of in person appointments. I also discussed with the patient that there may be a patient responsible charge related to this service. The patient expressed understanding and agreed to proceed.   I discussed the assessment and treatment plan with the patient. The patient was provided an opportunity to ask questions and all were answered. The patient agreed with the plan and demonstrated an understanding of the instructions.   The patient was advised to call back or seek an in-person evaluation if the symptoms worsen or if the condition fails to improve as anticipated.   BH MD OP Progress Note  02/03/2020 4:02 PM Lachrista Heslin  MRN:  [id number]  Chief Complaint:  Chief Complaint    Follow-up     HPI: Allison Nunez is a 34 year old Caucasian female, single, lives in [place], employed, has a history of bipolar disorder, PTSD, tobacco use disorder, history of ADHD, alcohol use disorder in remission was evaluated by telemedicine today.  Patient today reports she had a good Thanksgiving.  She reports she stopped taking the Abilify.  She reports it was causing her a lot of weight gain and since stopping it she has lost 3 pounds.  She reports overall her mood symptoms are okay however she does not want to decompensate and would like to be on a mood stabilizer to take in place of Abilify.  Discussed Lamictal since she does not want to be on anything that can cause weight gain.  She is interested.  She reports sleep is overall okay.  She continues to be on  doxepin.  Patient does report some problems with using the correct phrases or sentences and getting her sentences mixed up.  She reports this has been going on since the past week.  She reports she was ordering something at a fast food drive-through and got her words mixed up.  She however denies it as happening every day and did not appear to have any problems when writer was speaking to her today during the evaluation.  She denies any physical weakness or episodes of confusion or physical symptoms.  Patient denies any suicidality, homicidality or perceptual disturbances.  She does report she was recently diagnosed with leukocytosis and has to follow-up with oncologist.  She reports she was told her leukocytosis likely due to her smoking however she has to keep an eye on that. Patient denies any other concerns today.  Visit Diagnosis:    ICD-10-CM   1. Bipolar 1 disorder, mixed, moderate (HCC)  F31.62 lamoTRIgine (LAMICTAL) 25 MG tablet  2. PTSD (post-traumatic stress disorder)  F43.10   3. Tobacco use disorder  F17.200   4. History of ADHD  Z86.59   5. Alcohol use disorder, moderate, in sustained remission (HCC)  F10.21     Past Psychiatric History: I have reviewed past psychiatric history from my progress note on 12/25/2018.  Past trials of Depakote-hair loss, Seroquel, Prozac, Wellbutrin, risperidone, Adderall, Ritalin, Geodon, Abilify  Past Medical History:  Past Medical History:  Diagnosis Date  . ADHD (attention deficit hyperactivity disorder)   . Allergy   . Anxiety   .  Anxiety   . Bipolar 1 disorder (HCC)   . BV (bacterial vaginosis)   . Cervical dysplasia   . Depression   . Hypothyroidism   . PTSD (post-traumatic stress disorder)     Past Surgical History:  Procedure Laterality Date  . TUBAL LIGATION      Family Psychiatric History: I have reviewed family psychiatric history from my progress note on 12/25/2018  Family History:  Family History  Problem Relation Age  of Onset  . Breast cancer Mother   . COPD Mother   . Leukemia Father   . Bladder Cancer Father   . Anxiety disorder Sister   . Depression Sister   . Alcohol abuse Brother   . Drug abuse Brother     Social History: I have reviewed social history from my progress note on 12/25/2018 Social History   Socioeconomic History  . Marital status: Single    Spouse name: Not on file  . Number of children: 5  . Years of education: Not on file  . Highest education level: High school graduate  Occupational History  . Occupation: HOME HEALTH  Tobacco Use  . Smoking status: Current Every Day Smoker    Packs/day: 1.00    Years: 16.00    Pack years: 16.00    Types: Cigarettes  . Smokeless tobacco: Never Used  . Tobacco comment: 7 Cigarettes per day- reported 12/31/2019  Vaping Use  . Vaping Use: Former  Substance and Sexual Activity  . Alcohol use: Not Currently  . Drug use: Never  . Sexual activity: Yes    Birth control/protection: Condom  Other Topics Concern  . Not on file  Social History Narrative  . Not on file   Social Determinants of Health   Financial Resource Strain:   . Difficulty of Paying Living Expenses: Not on file  Food Insecurity:   . Worried About Programme researcher, broadcasting/film/video in the Last Year: Not on file  . Ran Out of Food in the Last Year: Not on file  Transportation Needs:   . Lack of Transportation (Medical): Not on file  . Lack of Transportation (Non-Medical): Not on file  Physical Activity:   . Days of Exercise per Week: Not on file  . Minutes of Exercise per Session: Not on file  Stress:   . Feeling of Stress : Not on file  Social Connections:   . Frequency of Communication with Friends and Family: Not on file  . Frequency of Social Gatherings with Friends and Family: Not on file  . Attends Religious Services: Not on file  . Active Member of Clubs or Organizations: Not on file  . Attends Banker Meetings: Not on file  . Marital Status: Not on  file    Allergies: No Known Allergies  Metabolic Disorder Labs: No results found for: HGBA1C, MPG No results found for: PROLACTIN No results found for: CHOL, TRIG, HDL, CHOLHDL, VLDL, LDLCALC No results found for: TSH  Therapeutic Level Labs: No results found for: LITHIUM No results found for: VALPROATE No components found for:  CBMZ  Current Medications: Current Outpatient Medications  Medication Sig Dispense Refill  . azithromycin (ZITHROMAX) 250 MG tablet Take 250 mg by mouth as directed.    . cephALEXin (KEFLEX) 500 MG capsule Take 500 mg by mouth 3 (three) times daily.    Marland Kitchen doxepin (SINEQUAN) 10 MG capsule Take 1-2 capsules (10-20 mg total) by mouth at bedtime as needed. SLEEP 60 capsule 2  . fluconazole (DIFLUCAN)  150 MG tablet Take 1 tablet by mouth single dose  for yeast infection. May repeat in 3 days if symptoms persist    . FLUoxetine (PROZAC) 40 MG capsule Take 1 capsule (40 mg total) by mouth daily. 30 capsule 2  . HYDROcodone-acetaminophen (NORCO/VICODIN) 5-325 MG tablet Take 1 tablet by mouth every 4 (four) hours as needed.    . hydrOXYzine (ATARAX/VISTARIL) 25 MG tablet Take 1 tablet (25 mg total) by mouth 3 (three) times daily as needed for anxiety. 90 tablet 2  . lamoTRIgine (LAMICTAL) 25 MG tablet Take 1 tablet (25 mg total) by mouth daily for 15 days, THEN 1 tablet (25 mg total) 2 (two) times daily for 15 days. 45 tablet 0  . nicotine (NICODERM CQ - DOSED IN MG/24 HOURS) 14 mg/24hr patch Place 1 patch (14 mg total) onto the skin daily. 28 patch 0  . nicotine (NICODERM CQ - DOSED IN MG/24 HR) 7 mg/24hr patch     . prazosin (MINIPRESS) 1 MG capsule TAKE 1 CAPSULE(1 MG) BY MOUTH AT BEDTIME FOR NIGHTMARES 30 capsule 2  . predniSONE (DELTASONE) 20 MG tablet Take 40 mg by mouth daily.    Marland Kitchen. tinidazole (TINDAMAX) 500 MG tablet Take 500 mg by mouth 2 (two) times daily.     No current facility-administered medications for this visit.     Musculoskeletal: Strength &  Muscle Tone: UTA Gait & Station: UTA Patient leans: N/A  Psychiatric Specialty Exam: Review of Systems  Neurological: Positive for speech difficulty (Getting her words mixed up- since 1 week).  Psychiatric/Behavioral: The patient is nervous/anxious.     There were no vitals taken for this visit.There is no height or weight on file to calculate BMI.  General Appearance: UTA  Eye Contact:  UTA  Speech:  Clear and Coherent  Volume:  Normal  Mood:  Anxious  Affect:  UTA  Thought Process:  Goal Directed and Descriptions of Associations: Intact  Orientation:  Full (Time, Place, and Person)  Thought Content: Logical   Suicidal Thoughts:  No  Homicidal Thoughts:  No  Memory:  Immediate;   Fair Recent;   Fair Remote;   Fair  Judgement:  Fair  Insight:  Fair  Psychomotor Activity:  UTA  Concentration:  Concentration: Fair and Attention Span: Fair  Recall:  Fis[employer]Fair  Fund of Knowledge: Fair  Language: Fair  Akathisia:  No  Handed:  Right  AIMS (if indicated): UTA  Assets:  Communication Skills Desire for Improvement Housing Social Support  ADL's:  Intact  Cognition: WNL  Sleep:  Fair   Screenings: GAD-7     [facility]  Total GAD-7 Score 18    PHQ2-9     [facility]  PHQ-2 Total Score 4 0  PHQ-9 Total Score 12 --       Assessment and Plan: Allison RangerCallie Mannings is a 34 year old Caucasian female, employed, single, lives in [place], has a history of bipolar disorder, ADHD, history of trauma was evaluated by telemedicine today.  She is biologically predisposed given her family history as well as history of trauma.  Patient with psychosocial stressors of recent health issues, relationship struggles, being a single mother, pandemic, job related problems, financial stressors.  Patient with  adverse side effects to Abilify currently noncompliant on it.  Patient continues to need medication management.  Plan as  noted below.  Plan Bipolar disorder mixed-improving Discontinue Abilify for noncompliance and side effects. Start Lamictal 25 mg p.o. daily for the next 2 weeks then increase to 25 mg p.o. twice daily Continue doxepin 20 mg p.o. nightly as needed for sleep.  PTSD-some progress Prozac 40 mg p.o. daily Prazosin 1 mg p.o. nightly Continue CBT with Ms. Juanito Doom. She was provided information for EMDR last visit-pending  Tobacco use disorder-improving We will monitor closely  Alcohol use disorder in remission Will monitor closely.  Discussed with patient to keep track of her speech problem, if she has any worsening problems she will go to the nearest emergency department.  We will consider a neurology referral if she continues to have problems, this was discussed with her.  Follow-up in clinic in 3 to 4 weeks or sooner if needed.  I have spent atleast 20 minutes non face to face by video with patient today. More than 50 % of the time was spent for preparing to see the patient ( e.g., review of test, records ), ordering medications and test ,psychoeducation and supportive psychotherapy and care coordination,as well as documenting clinical information in electronic health record. This note was generated in part or whole with voice recognition software. Voice recognition is usually quite accurate but there are transcription errors that can and very often do occur. I apologize for any typographical errors that were not detected and corrected.      Jomarie Longs, MD 02/04/2020, 8:40 AM

## 2020-02-04 ENCOUNTER — Encounter: Payer: Self-pay | Admitting: Psychiatry

## 2020-02-17 ENCOUNTER — Other Ambulatory Visit: Payer: Self-pay

## 2020-02-17 ENCOUNTER — Ambulatory Visit: Payer: Medicaid Other | Admitting: Licensed Clinical Social Worker

## 2020-03-03 ENCOUNTER — Other Ambulatory Visit: Payer: Self-pay

## 2020-03-03 ENCOUNTER — Encounter: Payer: Self-pay | Admitting: Psychiatry

## 2020-03-03 ENCOUNTER — Telehealth (INDEPENDENT_AMBULATORY_CARE_PROVIDER_SITE_OTHER): Payer: Medicaid Other | Admitting: Psychiatry

## 2020-03-03 DIAGNOSIS — F3162 Bipolar disorder, current episode mixed, moderate: Secondary | ICD-10-CM

## 2020-03-03 DIAGNOSIS — F17200 Nicotine dependence, unspecified, uncomplicated: Secondary | ICD-10-CM | POA: Diagnosis not present

## 2020-03-03 DIAGNOSIS — F1021 Alcohol dependence, in remission: Secondary | ICD-10-CM

## 2020-03-03 DIAGNOSIS — Z8659 Personal history of other mental and behavioral disorders: Secondary | ICD-10-CM | POA: Diagnosis not present

## 2020-03-03 DIAGNOSIS — F4310 Post-traumatic stress disorder, unspecified: Secondary | ICD-10-CM | POA: Diagnosis not present

## 2020-03-03 NOTE — Progress Notes (Signed)
Virtual Visit via Telephone Note  I connected with Allison Nunez on 03/03/20 at 11:30 AM EST by telephone and verified that I am speaking with the correct person using two identifiers.  Location Provider Location : [facility] Patient Location : Work  Participants: Patient , Provider    I discussed the limitations, risks, security and privacy concerns of performing an evaluation and management service by telephone and the availability of in person appointments. I also discussed with the patient that there may be a patient responsible charge related to this service. The patient expressed understanding and agreed to proceed.    I discussed the assessment and treatment plan with the patient. The patient was provided an opportunity to ask questions and all were answered. The patient agreed with the plan and demonstrated an understanding of the instructions.   The patient was advised to call back or seek an in-person evaluation if the symptoms worsen or if the condition fails to improve as anticipated.   BH MD OP Progress Note  03/03/2020 12:01 PM Allison Nunez  MRN:  [id number]  Chief Complaint:  Chief Complaint    Follow-up     HPI: Allison Nunez is a 35 year old Caucasian female, single, lives in [place], employed, has a history of bipolar disorder, PTSD, tobacco use disorder, history of ADHD, alcohol use disorder in remission was evaluated by telemedicine today.  Patient today reports she is currently struggling with situational stressors of the recent holiday season, kids being on break, as well as her own health issues.  She continues to struggle with anxiety, memory problems on and off, sleep problems.  She however reports the doxepin does help with the sleep.  She has been using it more frequently now.  She however did not start the Lamictal as discussed last visit.  She reports she was worried about the side effects.  She reports she has upcoming endocrinology follow-up scheduled and is  currently getting testing done.  Patient reports she wants to wait until she finds out what is wrong with her medically before she starts the mood stabilizer.  Patient denies any suicidality at this time.  She however does report suicidal thoughts during the holiday break.  She was able to talk herself down and it got better.  Patient denies any other concerns today.  Visit Diagnosis:    ICD-10-CM   1. Bipolar 1 disorder, mixed, moderate (HCC)  F31.62   2. PTSD (post-traumatic stress disorder)  F43.10   3. Tobacco use disorder  F17.200   4. History of ADHD  Z86.59   5. Alcohol use disorder, moderate, in sustained remission (HCC)  F10.21     Past Psychiatric History: I have reviewed past psychiatric history from my progress note on 12/25/2018.  Past trials of Depakote-hair loss, Seroquel, Prozac, Wellbutrin, risperidone, Adderall, Ritalin, Geodon, Abilify Past Medical History:  Past Medical History:  Diagnosis Date  . ADHD (attention deficit hyperactivity disorder)   . Allergy   . Anxiety   . Anxiety   . Bipolar 1 disorder (HCC)   . BV (bacterial vaginosis)   . Cervical dysplasia   . Depression   . Hypothyroidism   . PTSD (post-traumatic stress disorder)     Past Surgical History:  Procedure Laterality Date  . TUBAL LIGATION      Family Psychiatric History: I have reviewed family psychiatric history from my progress note on 12/25/2018  Family History:  Family History  Problem Relation Age of Onset  . Breast cancer Mother   .  COPD Mother   . Leukemia Father   . Bladder Cancer Father   . Anxiety disorder Sister   . Depression Sister   . Alcohol abuse Brother   . Drug abuse Brother     Social History: Reviewed social history from my progress note on 12/25/2018 Social History   Socioeconomic History  . Marital status: Single    Spouse name: Not on file  . Number of children: 5  . Years of education: Not on file  . Highest education level: High school graduate   Occupational History  . Occupation: HOME HEALTH  Tobacco Use  . Smoking status: Current Every Day Smoker    Packs/day: 1.00    Years: 16.00    Pack years: 16.00    Types: Cigarettes  . Smokeless tobacco: Never Used  . Tobacco comment: 7 Cigarettes per day- reported 12/31/2019  Vaping Use  . Vaping Use: Former  Substance and Sexual Activity  . Alcohol use: Not Currently  . Drug use: Never  . Sexual activity: Yes    Birth control/protection: Condom  Other Topics Concern  . Not on file  Social History Narrative  . Not on file   Social Determinants of Health   Financial Resource Strain: Not on file  Food Insecurity: Not on file  Transportation Needs: Not on file  Physical Activity: Not on file  Stress: Not on file  Social Connections: Not on file    Allergies: No Known Allergies  Metabolic Disorder Labs: No results found for: HGBA1C, MPG No results found for: PROLACTIN No results found for: CHOL, TRIG, HDL, CHOLHDL, VLDL, LDLCALC No results found for: TSH  Therapeutic Level Labs: No results found for: LITHIUM No results found for: VALPROATE No components found for:  CBMZ  Current Medications: Current Outpatient Medications  Medication Sig Dispense Refill  . azithromycin (ZITHROMAX) 250 MG tablet Take 250 mg by mouth as directed.    . cephALEXin (KEFLEX) 500 MG capsule Take 500 mg by mouth 3 (three) times daily.    . cyanocobalamin 1000 MCG tablet Take by mouth.    . dexamethasone (DECADRON) 1 MG tablet Take 1 mg by mouth once.    . doxepin (SINEQUAN) 10 MG capsule Take 1-2 capsules (10-20 mg total) by mouth at bedtime as needed. SLEEP 60 capsule 2  . ergocalciferol (VITAMIN D2) 1.25 MG (50000 UT) capsule Take by mouth.    . fluconazole (DIFLUCAN) 150 MG tablet Take 1 tablet by mouth single dose  for yeast infection. May repeat in 3 days if symptoms persist    . FLUoxetine (PROZAC) 40 MG capsule Take 1 capsule (40 mg total) by mouth daily. 30 capsule 2  .  HYDROcodone-acetaminophen (NORCO/VICODIN) 5-325 MG tablet Take 1 tablet by mouth every 4 (four) hours as needed.    . hydrOXYzine (ATARAX/VISTARIL) 25 MG tablet Take 1 tablet (25 mg total) by mouth 3 (three) times daily as needed for anxiety. 90 tablet 2  . lamoTRIgine (LAMICTAL) 25 MG tablet Take 1 tablet (25 mg total) by mouth daily for 15 days, THEN 1 tablet (25 mg total) 2 (two) times daily for 15 days. 45 tablet 0  . nicotine (NICODERM CQ - DOSED IN MG/24 HOURS) 14 mg/24hr patch Place 1 patch (14 mg total) onto the skin daily. 28 patch 0  . nicotine (NICODERM CQ - DOSED IN MG/24 HR) 7 mg/24hr patch     . prazosin (MINIPRESS) 1 MG capsule TAKE 1 CAPSULE(1 MG) BY MOUTH AT BEDTIME FOR NIGHTMARES 30  capsule 2  . predniSONE (DELTASONE) 20 MG tablet Take 40 mg by mouth daily.    Marland Kitchen tinidazole (TINDAMAX) 500 MG tablet Take 500 mg by mouth 2 (two) times daily.     No current facility-administered medications for this visit.     Musculoskeletal: Strength & Muscle Tone: UTA Gait & Station: UTA Patient leans: N/A  Psychiatric Specialty Exam: Review of Systems  Psychiatric/Behavioral: Positive for dysphoric mood and sleep disturbance. The patient is nervous/anxious.   All other systems reviewed and are negative.   There were no vitals taken for this visit.There is no height or weight on file to calculate BMI.  General Appearance: UTA  Eye Contact:  UTA  Speech:  Clear and Coherent  Volume:  Normal  Mood:  Anxious and Mood swings  Affect:  UTA  Thought Process:  Goal Directed and Descriptions of Associations: Intact  Orientation:  Full (Time, Place, and Person)  Thought Content: Logical   Suicidal Thoughts:  No  Homicidal Thoughts:  No  Memory:  Immediate;   Fair Recent;   Fair Remote;   Fair  Judgement:  Fair  Insight:  Fair  Psychomotor Activity:  UTA  Concentration:  Concentration: Fair and Attention Span: Fair  Recall:  [employer] of Knowledge: Fair  Language: Fair  Akathisia:   No  Handed:  Right  AIMS (if indicated): UTA  Assets:  Communication Skills Desire for Improvement Housing Social Support Talents/Skills  ADL's:  Intact  Cognition: WNL  Sleep:  Restless   Screenings: GAD-7   Flowsheet Row Office Visit from 04/24/2018 in Center for [facility]  Total GAD-7 Score 18    PHQ2-9   Flowsheet Row Office Visit from 04/24/2018 in Center for [facility] Office Visit from 01/09/2018 in [facility]  PHQ-2 Total Score 4 0  PHQ-9 Total Score 12 --       Assessment and Plan: Yarelie Hams is a 35 year old Caucasian female, employed, single, lives in [place], has a history of bipolar disorder, ADHD, history of trauma was evaluated by telemedicine today.  Patient is biologically predisposed given her family history, history of trauma.  Patient with psychosocial stressors of recent health issues, relationship struggles, being a single mother, medical problems, financial stressors.  Patient also has been noncompliant with medication recommendations.  Plan as noted below.  Plan Bipolar disorder mixed-unstable Discontinue Lamictal for noncompliance Continue doxepin 20 mg p.o. nightly as needed for sleep Patient wants to wait until her endocrinology follow-up visit, prior to initiating a mood stabilizer.  PTSD-some progress Prozac 40 mg p.o. daily Continue doxepin as prescribed Continue CBT with Ms. Juanito Doom. Patient was provided information for EMDR-pending  Tobacco use disorder-unstable Will monitor closely.  Provided counseling.  Alcohol use disorder in remission We will monitor closely  Discussed with patient if she continues to have memory changes, will recommend brain imaging as well as neurology consult.  She will Pharmacist, hospital know.  Also provided education about the need for compliance.  Follow-up in clinic in 3 to 4 weeks or sooner if needed.  I have spent atleast 20  minutes non face to face  with patient today. More than 50 % of the time was spent for preparing to see the patient ( e.g., review of test, records ),ordering medications and test ,psychoeducation and supportive psychotherapy and care coordination,as well as documenting clinical information in electronic health record. This note was generated in part or whole with voice recognition  software. Voice recognition is usually quite accurate but there are transcription errors that can and very often do occur. I apologize for any typographical errors that were not detected and corrected.      Ursula Alert, MD 03/04/2020, 8:24 AM

## 2020-04-01 DIAGNOSIS — R87613 High grade squamous intraepithelial lesion on cytologic smear of cervix (HGSIL): Secondary | ICD-10-CM | POA: Insufficient documentation

## 2020-04-02 DIAGNOSIS — Z0289 Encounter for other administrative examinations: Secondary | ICD-10-CM

## 2020-05-14 IMAGING — US US TRANSVAGINAL NON-OB
1 series · 13 of 25 positions shown · non-contrast
Comparison: None

CLINICAL DATA: Dysfunctional uterine bleeding; LMP 04/28/2018

EXAM:
ULTRASOUND PELVIS TRANSVAGINAL
TECHNIQUE: Transvaginal ultrasound examination of the pelvis was performed
including evaluation of the uterus, ovaries, adnexal regions, and
pelvic cul-de-sac.

[Series 1: us transvaginal non-ob · 0.13mm/px · 13 of 77 slices shown]
[im 1/77]
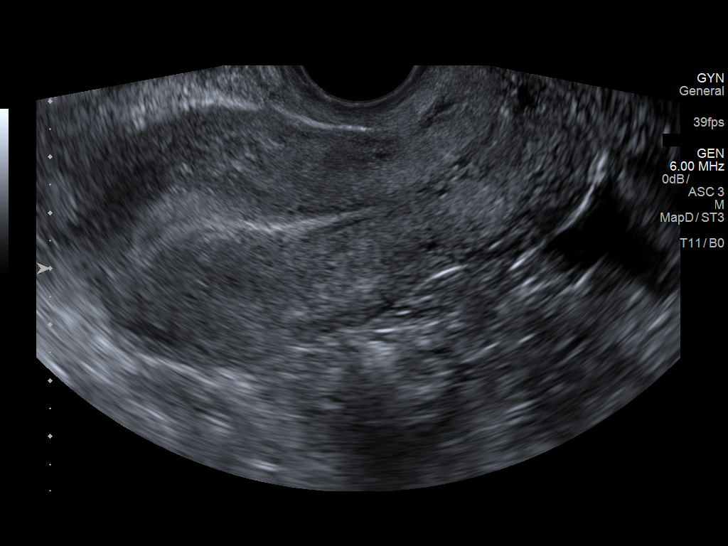
[im 7/77]
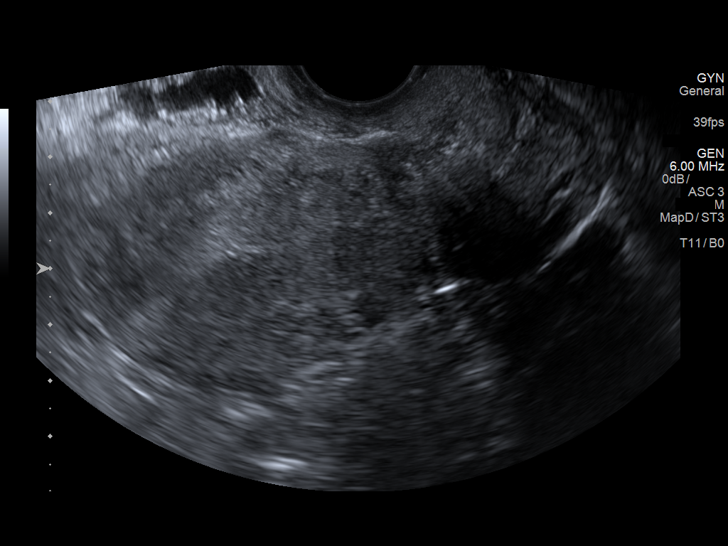
[im 13/77]
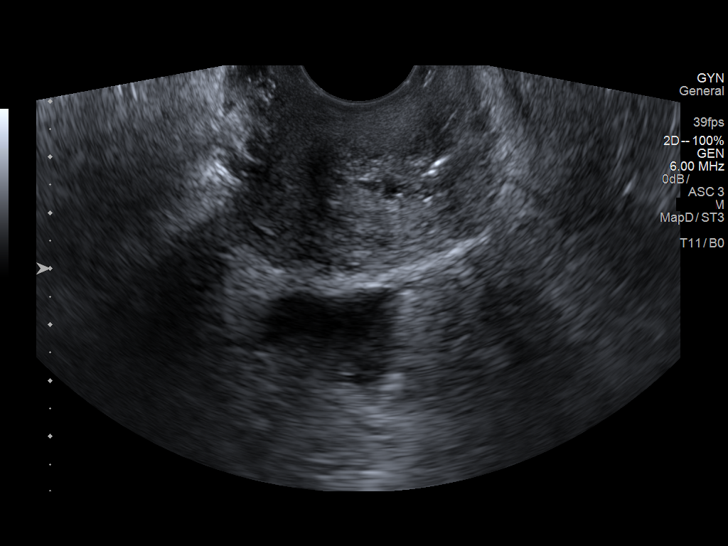
[im 20/77]
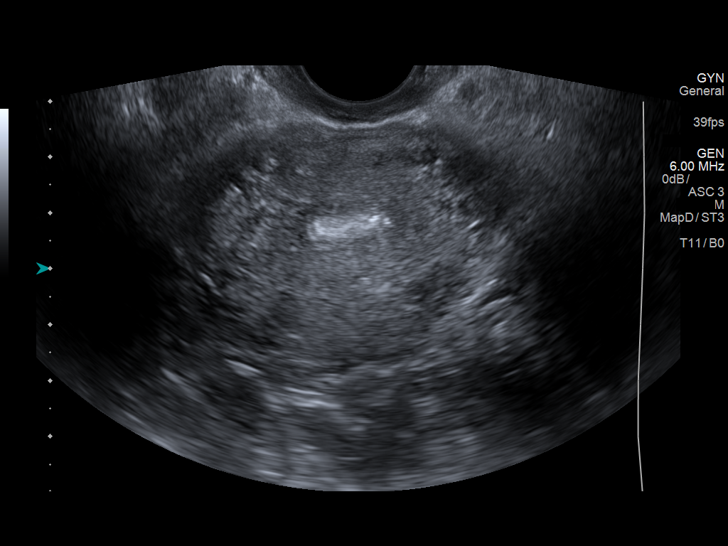
[im 26/77]
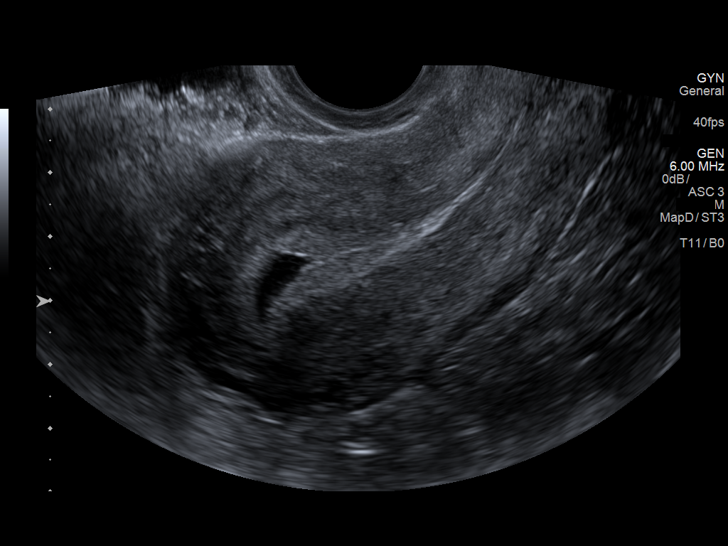
[im 32/77]
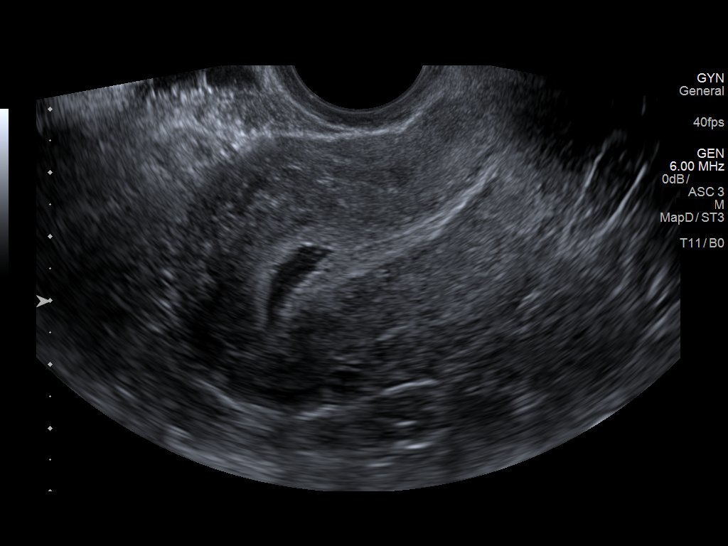
[im 39/77]
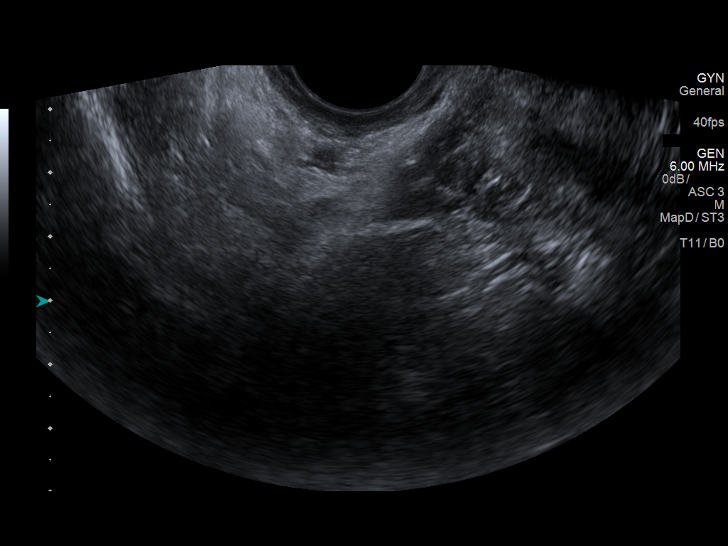
[im 45/77]
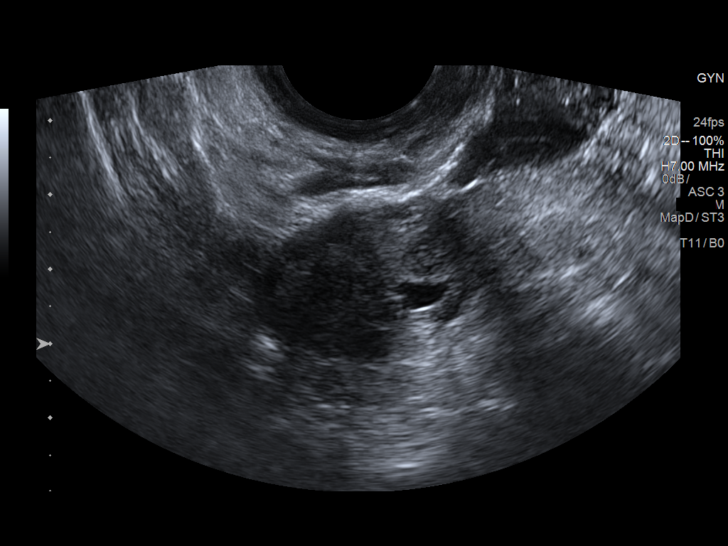
[im 51/77]
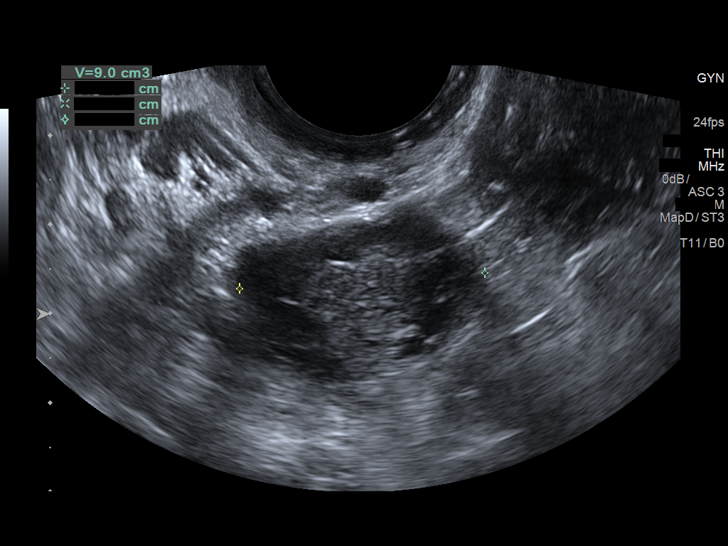
[im 58/77]
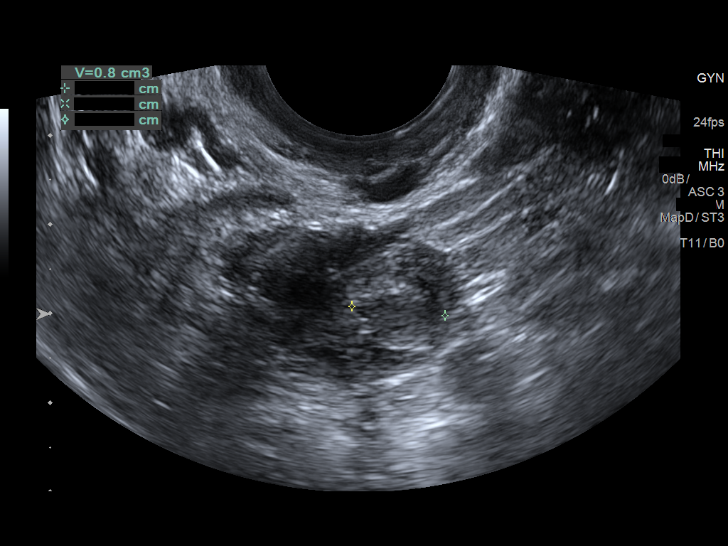
[im 64/77]
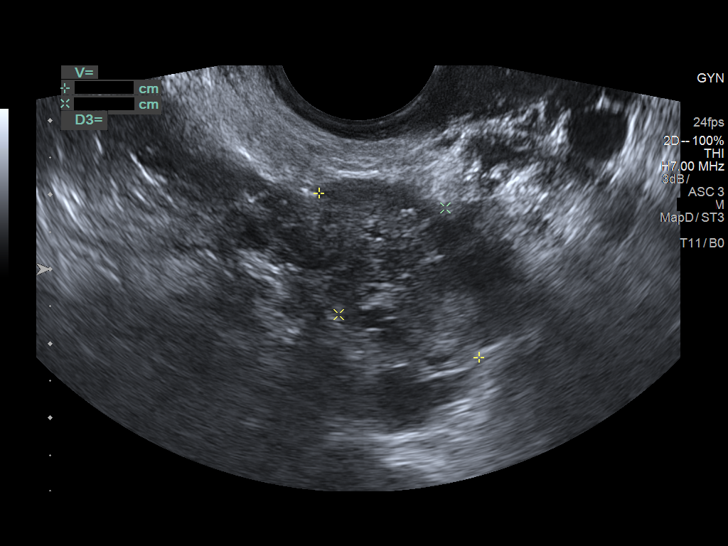
[im 70/77]
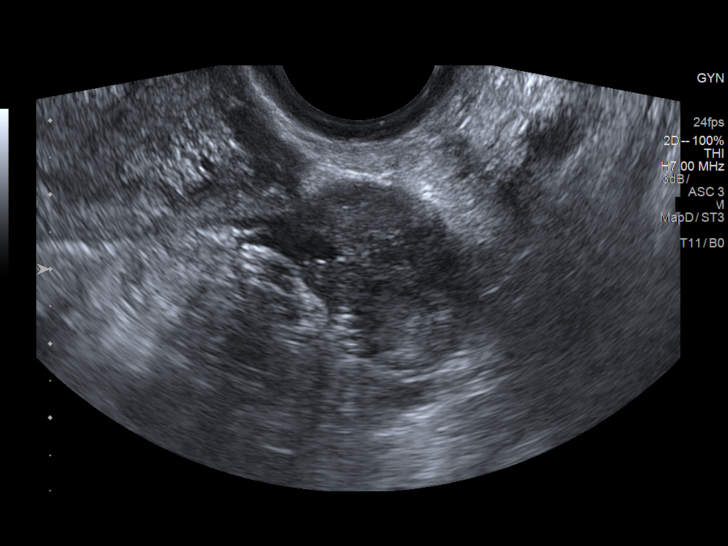
[im 77/77]
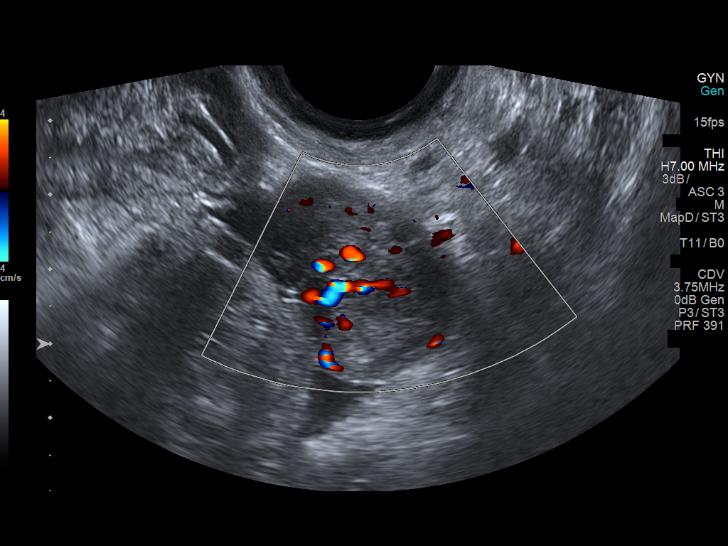

[13 of 25 positions shown; findings below may reference images not displayed]

FINDINGS: Uterus

Measurements: 9.5 x 4.8 x 5.9 cm = volume: 141 mL. Normal morphology
without mass

Endometrium

Thickness: 11 mm. Endometrial fluid identified at upper uterine
segment, approximately 14 x 5 x 15 mm in size. No focal endometrial
mass visualized.

Right ovary

Measurements: 3.0 x 2.0 x 2.8 cm = volume: 9.0 mL. Small slightly
hyperechoic nodule within RIGHT ovary, nonspecific, 1.4 cm greatest
diameter.

Left ovary

Measurements: 3.1 x 2.0 x 2.3 cm = volume: 7.6 mL. Normal morphology
without mass

Other findings:  No free fluid.  No additional adnexal masses.
IMPRESSION: Nonspecific endometrial fluid with 11 mm endometrial complex.

Small hyperechoic nodule within RIGHT ovary, nonspecific /
indeterminate; short-interval follow up ultrasound in 6-12 weeks is
recommended, preferably during the week following the patient's
normal menses to ensure this is a hemorrhagic cyst or resolving
corpus luteum, and exclude a developing solid nodule.

## 2020-07-28 DIAGNOSIS — D72829 Elevated white blood cell count, unspecified: Secondary | ICD-10-CM | POA: Insufficient documentation

## 2020-07-29 NOTE — Progress Notes (Signed)
[facility]  [address] [place],  [place]  [address] [phone]  Clinic Day:  07/31/2020  Referring physician:  Evie Lacks, NP  This document serves as a record of services personally performed by Weston Settle, MD. It was created on their behalf by Curry,Lauren E, a trained medical scribe. The creation of this record is based on the scribe's personal observations and the provider's statements to them.  HISTORY OF PRESENT ILLNESS:  The patient is a 35 y.o. female with leukocytosis.  Based upon her history, smoking has been a major culprit behind this.  She also has had an increased number of infections.  She comes in today to reassess her white count.  Since her last visit, the patient has been doing well.  She brings to my attention that she has stopped smoking.  However, she occasionally vapes.  She denies having B symptoms, such as fevers, drenching night sweats or unexplained weight loss, which concern her for her leukocytosis being due to an underlying hematologic malignancy.    PHYSICAL EXAM:  Blood pressure 124/69, pulse (!) 101, temperature 98.4 F (36.9 C), resp. rate 16, height 5\' 5"  (1.651 m), weight 173 lb 1.6 oz (78.5 kg), SpO2 97 %. Wt Readings from Last 3 Encounters:  07/31/20 173 lb 1.6 oz (78.5 kg)  01/31/20 172 lb 9.6 oz (78.3 kg)  04/24/18 151 lb 12.8 oz (68.9 kg)   Body mass index is 28.81 kg/m. Performance status (ECOG): 0 Physical Exam Constitutional:      Appearance: Normal appearance. She is not ill-appearing.  HENT:     Mouth/Throat:     Mouth: Mucous membranes are moist.     Pharynx: Oropharynx is clear. No oropharyngeal exudate or posterior oropharyngeal erythema.  Cardiovascular:     Rate and Rhythm: Normal rate and regular rhythm.     Heart sounds: No murmur heard. No friction rub. No gallop.   Pulmonary:     Effort: Pulmonary effort is normal. No respiratory distress.     Breath sounds: Normal breath  sounds. No wheezing, rhonchi or rales.  Chest:  Breasts:     Right: No axillary adenopathy or supraclavicular adenopathy.     Left: No axillary adenopathy or supraclavicular adenopathy.    Abdominal:     General: Bowel sounds are normal. There is no distension.     Palpations: Abdomen is soft. There is no mass.     Tenderness: There is no abdominal tenderness.  Musculoskeletal:        General: No swelling.     Right lower leg: No edema.     Left lower leg: No edema.  Lymphadenopathy:     Cervical: No cervical adenopathy.     Upper Body:     Right upper body: No supraclavicular or axillary adenopathy.     Left upper body: No supraclavicular or axillary adenopathy.     Lower Body: No right inguinal adenopathy. No left inguinal adenopathy.  Skin:    General: Skin is warm.     Coloration: Skin is not jaundiced.     Findings: No lesion or rash.  Neurological:     General: No focal deficit present.     Mental Status: She is alert and oriented to person, place, and time. Mental status is at baseline.     Cranial Nerves: Cranial nerves are intact.  Psychiatric:        Mood and Affect: Mood normal.  Behavior: Behavior normal.        Thought Content: Thought content normal.    LABS:   CBC Latest Ref Rng & Units 07/31/2020 01/31/2020 04/24/2018  WBC - 10.6 14.6 14.6(H)  Hemoglobin 12.0 - 16.0 14.7 14.7 13.8  Hematocrit 36 - 46 42 44 42.1  Platelets 150 - 399 212 232 202    ASSESSMENT & PLAN:  A 35 y.o. female with leukocytosis.  I am pleased as her white count has returned to normal. In fact, every hematologic parameter on her CBC today is normal.  Unsurprisingly, her white count has normalized with the discontinuation of her smoking.  Clinically, she is doing very well.  As she has no pressing hematologic issues, I do feel comfortable turning her care back over to her other physicians, with the recommendation that her CBC be checked only on an as-needed basis.  The patient  understands all the plans discussed today and is in agreement with them.   I, Foye Deer, am acting as scribe for Weston Settle, MD    I have reviewed this report as typed by the medical scribe, and it is complete and accurate.  Dequincy Kirby Funk, MD

## 2020-07-31 ENCOUNTER — Other Ambulatory Visit: Payer: Self-pay | Admitting: Oncology

## 2020-07-31 ENCOUNTER — Encounter: Payer: Self-pay | Admitting: Oncology

## 2020-07-31 ENCOUNTER — Inpatient Hospital Stay: Payer: Medicaid Other | Attending: Oncology

## 2020-07-31 ENCOUNTER — Inpatient Hospital Stay (INDEPENDENT_AMBULATORY_CARE_PROVIDER_SITE_OTHER): Payer: Medicaid Other | Admitting: Oncology

## 2020-07-31 ENCOUNTER — Other Ambulatory Visit: Payer: Self-pay

## 2020-07-31 DIAGNOSIS — D721 Eosinophilia, unspecified: Secondary | ICD-10-CM | POA: Diagnosis not present

## 2020-07-31 DIAGNOSIS — D72829 Elevated white blood cell count, unspecified: Secondary | ICD-10-CM

## 2020-07-31 LAB — CBC AND DIFFERENTIAL
HCT: 42 (ref 36–46)
Hemoglobin: 14.7 (ref 12.0–16.0)
Neutrophils Absolute: 6.89
Platelets: 212 (ref 150–399)
WBC: 10.6

## 2020-07-31 LAB — CBC: RBC: 4.61 (ref 3.87–5.11)

## 2020-11-26 DIAGNOSIS — L03032 Cellulitis of left toe: Secondary | ICD-10-CM | POA: Insufficient documentation

## 2022-06-30 DIAGNOSIS — N898 Other specified noninflammatory disorders of vagina: Secondary | ICD-10-CM | POA: Insufficient documentation

## 2022-06-30 DIAGNOSIS — Z7251 High risk heterosexual behavior: Secondary | ICD-10-CM | POA: Insufficient documentation

## 2022-06-30 DIAGNOSIS — R21 Rash and other nonspecific skin eruption: Secondary | ICD-10-CM | POA: Insufficient documentation

## 2022-06-30 DIAGNOSIS — R102 Pelvic and perineal pain: Secondary | ICD-10-CM | POA: Insufficient documentation

## 2022-07-21 DIAGNOSIS — N871 Moderate cervical dysplasia: Secondary | ICD-10-CM | POA: Insufficient documentation

## 2022-07-21 DIAGNOSIS — N870 Mild cervical dysplasia: Secondary | ICD-10-CM | POA: Insufficient documentation

## 2022-07-21 DIAGNOSIS — Z9889 Other specified postprocedural states: Secondary | ICD-10-CM | POA: Insufficient documentation

## 2023-06-30 ENCOUNTER — Encounter: Payer: Self-pay | Admitting: Cardiology

## 2023-06-30 ENCOUNTER — Ambulatory Visit: Attending: Cardiology

## 2023-06-30 ENCOUNTER — Ambulatory Visit: Attending: Cardiology | Admitting: Cardiology

## 2023-06-30 VITALS — BP 104/70 | HR 104 | Ht 65.0 in | Wt 183.8 lb

## 2023-06-30 DIAGNOSIS — R0609 Other forms of dyspnea: Secondary | ICD-10-CM

## 2023-06-30 DIAGNOSIS — R009 Unspecified abnormalities of heart beat: Secondary | ICD-10-CM

## 2023-06-30 DIAGNOSIS — I10 Essential (primary) hypertension: Secondary | ICD-10-CM | POA: Diagnosis not present

## 2023-06-30 DIAGNOSIS — R002 Palpitations: Secondary | ICD-10-CM

## 2023-06-30 DIAGNOSIS — F17200 Nicotine dependence, unspecified, uncomplicated: Secondary | ICD-10-CM | POA: Diagnosis not present

## 2023-06-30 NOTE — Addendum Note (Signed)
 Addended by: Shawnee Dellen D on: 06/30/2023 09:29 AM   Modules accepted: Orders

## 2023-06-30 NOTE — Progress Notes (Signed)
 Cardiology Consultation:    Date:  06/30/2023   ID:  Allison Nunez, DOB February 06, 1986, MRN [id number]  PCP:  Angelique Barer, MD  Cardiologist:  Ralene Burger, MD   Referring MD: [facility]   Chief Complaint  Patient presents with   elevated heart rate    History of Present Illness:    Allison Nunez is a 38 y.o. female who is being seen today for the evaluation of tachycardia/palpitations at the request of [facility]  Past medical history significant for hyper and hypothyroidism, posttraumatic stress disorder, anxiety, ex-smoker but now vaping.  She was referred to us  because of palpitations.  She was noted to have elevated heart rate.  She was taking medication to lose weight stop those medications tachycardia became better majority of time she noticed tachycardia on her Apple Watch.  Sometimes she feel her heart speeding up.  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  No chest pain tightness squeezing pressure burning chest no dizziness no passing out.  She used to smoke but quit now she is vaping.  She was taking a lot of different kind of medication including medication for arthritis but she does not like to take medications and at the moment she was prescribed medication for her arteries that she wants to see cardiologist.  Denies have any cardiac problems.  She does have family history of premature coronary disease  Past Medical History:  Diagnosis Date   ADHD (attention deficit hyperactivity disorder)    Allergy    Anxiety    Anxiety    Bipolar 1 disorder (HCC)    BV (bacterial vaginosis)    Cervical dysplasia    Depression    Hypothyroidism    PTSD (post-traumatic stress disorder)     Past Surgical History:  Procedure Laterality Date   TUBAL LIGATION      Current Medications: Current Meds  Medication Sig   [DISCONTINUED] azithromycin (ZITHROMAX) 250 MG tablet Take 250  mg by mouth as directed.   [DISCONTINUED] cephALEXin (KEFLEX) 500 MG capsule Take 500 mg by mouth 3 (three) times daily.   [DISCONTINUED] cyanocobalamin 1000 MCG tablet Take 1,000 mcg by mouth daily.   [DISCONTINUED] dexamethasone (DECADRON) 1 MG tablet Take 1 mg by mouth once.   [DISCONTINUED] doxepin  (SINEQUAN ) 10 MG capsule Take 1-2 capsules (10-20 mg total) by mouth at bedtime as needed. SLEEP (Patient taking differently: Take 10-20 mg by mouth at bedtime as needed (depression). SLEEP)   [DISCONTINUED] doxycycline (VIBRAMYCIN) 100 MG capsule Take 100 mg by mouth 2 (two) times daily.   [DISCONTINUED] ergocalciferol (VITAMIN D2) 1.25 MG (50000 UT) capsule Take 50,000 Units by mouth once a week.   [DISCONTINUED] fluconazole (DIFLUCAN) 150 MG tablet Take 150 mg by mouth once.   [DISCONTINUED] FLUoxetine  (PROZAC ) 40 MG capsule Take 1 capsule (40 mg total) by mouth daily.   [DISCONTINUED] HYDROcodone-acetaminophen (NORCO/VICODIN) 5-325 MG tablet Take 1 tablet by mouth every 4 (four) hours as needed for moderate pain (pain score 4-6).   [DISCONTINUED] hydrOXYzine  (ATARAX /VISTARIL ) 25 MG tablet Take 1 tablet (25 mg total) by mouth 3 (three) times daily as needed for anxiety.   [DISCONTINUED] lamoTRIgine  (LAMICTAL ) 25 MG tablet Take 1 tablet (25 mg total) by mouth daily for 15 days, THEN 1 tablet (25 mg total) 2 (two) times daily for 15 days.   [DISCONTINUED] metoprolol succinate (TOPROL-XL) 50 MG 24 hr tablet  Take 50 mg by mouth daily.   [DISCONTINUED] nabumetone (RELAFEN) 750 MG tablet Take 750 mg by mouth 2 (two) times daily as needed.   [DISCONTINUED] nicotine  (NICODERM CQ  - DOSED IN MG/24 HOURS) 14 mg/24hr patch Place 1 patch (14 mg total) onto the skin daily.   [DISCONTINUED] nicotine  (NICODERM CQ  - DOSED IN MG/24 HR) 7 mg/24hr patch Place 7 mg onto the skin daily.   [DISCONTINUED] phentermine 30 MG capsule Take 30 mg by mouth daily.   [DISCONTINUED] prazosin  (MINIPRESS ) 1 MG capsule TAKE 1  CAPSULE(1 MG) BY MOUTH AT BEDTIME FOR NIGHTMARES (Patient taking differently: Take 1 mg by mouth at bedtime. TAKE 1 CAPSULE(1 MG) BY MOUTH AT BEDTIME FOR NIGHTMARES)   [DISCONTINUED] predniSONE (DELTASONE) 20 MG tablet Take 40 mg by mouth daily.   [DISCONTINUED] tinidazole  (TINDAMAX ) 500 MG tablet Take 500 mg by mouth 2 (two) times daily.   [DISCONTINUED] tiZANidine (ZANAFLEX) 4 MG tablet Take 4 mg by mouth 3 (three) times daily as needed.   [DISCONTINUED] topiramate (TOPAMAX) 50 MG tablet Take 50 mg by mouth 2 (two) times daily.   [DISCONTINUED] valACYclovir (VALTREX) 1000 MG tablet Take 2,000 mg by mouth 2 (two) times daily.     Allergies:   Patient has no known allergies.   Social History   Socioeconomic History   Marital status: Single    Spouse name: Not on file   Number of children: 5   Years of education: Not on file   Highest education level: High school graduate  Occupational History   Occupation: HOME HEALTH  Tobacco Use   Smoking status: Former    Current packs/day: 1.00    Average packs/day: 1 pack/day for 16.0 years (16.0 ttl pk-yrs)    Types: Cigarettes   Smokeless tobacco: Never   Tobacco comments:    7 Cigarettes per day- reported 12/31/2019  Vaping Use   Vaping status: Every Day  Substance and Sexual Activity   Alcohol use: Not Currently   Drug use: Never   Sexual activity: Yes    Birth control/protection: Condom  Other Topics Concern   Not on file  Social History Narrative   Not on file   Social Drivers of Health   Financial Resource Strain: Medium Risk (12/25/2018)   Overall Financial Resource Strain (CARDIA)    Difficulty of Paying Living Expenses: Somewhat hard  Food Insecurity: Food Insecurity Present (12/25/2018)   Hunger Vital Sign    Worried About Running Out of Food in the Last Year: Sometimes true    Ran Out of Food in the Last Year: Sometimes true  Transportation Needs: No Transportation Needs (12/25/2018)   PRAPARE - Therapist, art (Medical): No    Lack of Transportation (Non-Medical): No  Physical Activity: Inactive (12/25/2018)   Exercise Vital Sign    Days of Exercise per Week: 0 days    Minutes of Exercise per Session: 0 min  Stress: Stress Concern Present (12/25/2018)   [employer] of Occupational Health - Occupational Stress Questionnaire    Feeling of Stress : Rather much  Social Connections: Unknown (08/16/2022)   Received from [facility]   Social Connections    How often do you feel lonely or isolated from those around you? (Adult - for ages 66 years and over): Not on file     Family History: The patient's family history includes Alcohol abuse in her brother; Anxiety disorder in her sister; Bladder Cancer in her father; Breast cancer in her mother;  COPD in her mother; Depression in her sister; Drug abuse in her brother; Leukemia in her father. ROS:   Please see the history of present illness.    All 14 point review of systems negative except as described per history of present illness.  EKGs/Labs/Other Studies Reviewed:    The following studies were reviewed today:   EKG:  EKG Interpretation Date/Time:  Friday Jun 30 2023 08:39:17 EDT Ventricular Rate:  102 PR Interval:  166 QRS Duration:  72 QT Interval:  342 QTC Calculation: 445 R Axis:   39  Text Interpretation: Sinus tachycardia Low voltage QRS Nonspecific T wave abnormality Abnormal ECG No previous ECGs available Confirmed by Ralene Burger [phone]) on 06/30/2023 8:44:33 AM    Recent Labs: No results found for requested labs within last 365 days.  Recent Lipid Panel No results found for: "CHOL", "TRIG", "HDL", "CHOLHDL", "VLDL", "LDLCALC", "LDLDIRECT"  Physical Exam:    VS:  BP 104/70 (BP Location: Left Arm, Patient Position: Sitting)   Pulse (!) 104   Ht 5\' 5"  (1.651 m)   Wt 183 lb 12.8 oz (83.4 kg)   SpO2 99%   BMI 30.59 kg/m     Wt Readings from Last 3 Encounters:  06/30/23 183 lb 12.8 oz (83.4 kg)   07/31/20 173 lb 1.6 oz (78.5 kg)  01/31/20 172 lb 9.6 oz (78.3 kg)     GEN:  Well nourished, well developed in no acute distress HEENT: Normal NECK: No JVD; No carotid bruits LYMPHATICS: No lymphadenopathy CARDIAC: RRR, no murmurs, no rubs, no gallops RESPIRATORY:  Clear to auscultation without rales, wheezing or rhonchi  ABDOMEN: Soft, non-tender, non-distended MUSCULOSKELETAL:  No edema; No deformity  SKIN: Warm and dry NEUROLOGIC:  Alert and oriented x 3 PSYCHIATRIC:  Normal affect   ASSESSMENT:    1. Elevated heart rate with elevated blood pressure and diagnosis of hypertension   2. Palpitations   3. Tobacco use disorder   4. Dyspnea on exertion    PLAN:    In order of problems listed above:  Palpitations/tachycardia will put Zio patch for 2 weeks to see exactly what of arrhythmia family experiencing.  Part of her evaluation echocardiogram to be done to check left ventricle ejection fraction.  Since she does have history of thyroid problem between hypothyroidism and hypothyroidism will call primary care physician to get her recent TSH. Overall risk assessment.  Will try to get fasting lipid profile from primary care physician.  I encouraged her to be more active.  I encouraged her also to stop vaping. Dyspnea on exertion again echocardiogram will be done   Medication Adjustments/Labs and Tests Ordered: Current medicines are reviewed at length with the patient today.  Concerns regarding medicines are outlined above.  Orders Placed This Encounter  Procedures   EKG 12-Lead   No orders of the defined types were placed in this encounter.   Signed, Manfred Seed, MD, [facility]. 06/30/2023 9:02 AM    [facility]

## 2023-06-30 NOTE — Patient Instructions (Signed)
 Medication Instructions:  Your physician recommends that you continue on your current medications as directed. Please refer to the Current Medication list given to you today.  *If you need a refill on your cardiac medications before your next appointment, please call your pharmacy*   Lab Work: None Ordered If you have labs (blood work) drawn today and your tests are completely normal, you will receive your results only by: MyChart Message (if you have MyChart) OR A paper copy in the mail If you have any lab test that is abnormal or we need to change your treatment, we will call you to review the results.   Testing/Procedures:  WHY IS MY DOCTOR PRESCRIBING ZIO? The Zio system is proven and trusted by physicians to detect and diagnose irregular heart rhythms -- and has been prescribed to hundreds of thousands of patients.  The FDA has cleared the Zio system to monitor for many different kinds of irregular heart rhythms. In a study, physicians were able to reach a diagnosis 90% of the time with the Zio system1.  You can wear the Zio monitor -- a small, discreet, comfortable patch -- during your normal day-to-day activity, including while you sleep, shower, and exercise, while it records every single heartbeat for analysis.  1Barrett, P., et al. Comparison of 24 Hour Holter Monitoring Versus 14 Day Novel Adhesive Patch Electrocardiographic Monitoring. American Journal of Medicine, 2014.  ZIO VS. HOLTER MONITORING The Zio monitor can be comfortably worn for up to 14 days. Holter monitors can be worn for 24 to 48 hours, limiting the time to record any irregular heart rhythms you may have. Zio is able to capture data for the 51% of patients who have their first symptom-triggered arrhythmia after 48 hours.1  LIVE WITHOUT RESTRICTIONS The Zio ambulatory cardiac monitor is a small, unobtrusive, and water-resistant patch--you might even forget you're wearing it. The Zio monitor records and stores  every beat of your heart, whether you're sleeping, working out, or showering.    Your physician has requested that you have an echocardiogram. Echocardiography is a painless test that uses sound waves to create images of your heart. It provides your doctor with information about the size and shape of your heart and how well your heart's chambers and valves are working. This procedure takes approximately one hour. There are no restrictions for this procedure. Please do NOT wear cologne, perfume, aftershave, or lotions (deodorant is allowed). Please arrive 15 minutes prior to your appointment time.  Please note: We ask at that you not bring children with you during ultrasound (echo/ vascular) testing. Due to room size and safety concerns, children are not allowed in the ultrasound rooms during exams. Our front office staff cannot provide observation of children in our lobby area while testing is being conducted. An adult accompanying a patient to their appointment will only be allowed in the ultrasound room at the discretion of the ultrasound technician under special circumstances. We apologize for any inconvenience.    Follow-Up: At [facility], you and your health needs are our priority.  As part of our continuing mission to provide you with exceptional heart care, we have created designated Provider Care Teams.  These Care Teams include your primary Cardiologist (physician) and Advanced Practice Providers (APPs -  Physician Assistants and Nurse Practitioners) who all work together to provide you with the care you need, when you need it.  We recommend signing up for the patient portal called "MyChart".  Sign up information is provided on this  After Visit Summary.  MyChart is used to connect with patients for Virtual Visits (Telemedicine).  Patients are able to view lab/test results, encounter notes, upcoming appointments, etc.  Non-urgent messages can be sent to your provider as well.   To learn more  about what you can do with MyChart, go to [email or web address].    Your next appointment:   2 month(s)  The format for your next appointment:   In Person  Provider:   Gypsy Balsam, MD    Other Instructions NA

## 2023-07-18 ENCOUNTER — Ambulatory Visit: Attending: Cardiology

## 2023-07-18 DIAGNOSIS — R0609 Other forms of dyspnea: Secondary | ICD-10-CM | POA: Insufficient documentation

## 2023-07-18 LAB — ECHOCARDIOGRAM COMPLETE
Area-P 1/2: 10.39 cm2
S' Lateral: 3.4 cm

## 2023-07-20 ENCOUNTER — Ambulatory Visit: Payer: Self-pay | Admitting: Cardiology

## 2023-08-04 ENCOUNTER — Telehealth: Payer: Self-pay

## 2023-08-04 NOTE — Telephone Encounter (Signed)
 Left message on My Chart with Echo results per Dr. Vanetta Shawl note. Routed to PCP.

## 2023-08-07 ENCOUNTER — Telehealth: Payer: Self-pay

## 2023-08-07 NOTE — Telephone Encounter (Signed)
 Pt viewed Echo results on My Chart per Dr. Vanetta Shawl note. Routed to PCP.

## 2023-08-14 DIAGNOSIS — R002 Palpitations: Secondary | ICD-10-CM

## 2023-08-15 ENCOUNTER — Telehealth: Payer: Self-pay

## 2023-08-15 NOTE — Telephone Encounter (Signed)
 Pt viewed Monitor results on My Chart per Dr. Vanetta Shawl note. Routed to PCP.

## 2023-08-30 ENCOUNTER — Other Ambulatory Visit (HOSPITAL_BASED_OUTPATIENT_CLINIC_OR_DEPARTMENT_OTHER): Payer: Self-pay | Admitting: Internal Medicine

## 2023-08-30 DIAGNOSIS — R102 Pelvic and perineal pain: Secondary | ICD-10-CM

## 2023-09-12 ENCOUNTER — Ambulatory Visit: Attending: Cardiology | Admitting: Cardiology

## 2024-01-16 ENCOUNTER — Other Ambulatory Visit: Payer: Self-pay

## 2024-01-16 ENCOUNTER — Inpatient Hospital Stay

## 2024-01-16 DIAGNOSIS — Z808 Family history of malignant neoplasm of other organs or systems: Secondary | ICD-10-CM | POA: Diagnosis not present

## 2024-01-16 DIAGNOSIS — Z800 Family history of malignant neoplasm of digestive organs: Secondary | ICD-10-CM | POA: Diagnosis not present

## 2024-01-16 DIAGNOSIS — Z801 Family history of malignant neoplasm of trachea, bronchus and lung: Secondary | ICD-10-CM | POA: Insufficient documentation

## 2024-01-16 DIAGNOSIS — Z803 Family history of malignant neoplasm of breast: Secondary | ICD-10-CM | POA: Insufficient documentation

## 2024-01-16 DIAGNOSIS — Z8052 Family history of malignant neoplasm of bladder: Secondary | ICD-10-CM | POA: Diagnosis not present

## 2024-01-16 DIAGNOSIS — Z1379 Encounter for other screening for genetic and chromosomal anomalies: Secondary | ICD-10-CM | POA: Diagnosis present

## 2024-01-16 DIAGNOSIS — Z8049 Family history of malignant neoplasm of other genital organs: Secondary | ICD-10-CM | POA: Diagnosis not present

## 2024-01-16 DIAGNOSIS — Z806 Family history of leukemia: Secondary | ICD-10-CM | POA: Insufficient documentation

## 2024-01-16 LAB — GENETIC SCREENING ORDER

## 2024-01-16 NOTE — Progress Notes (Signed)
 REFERRING PROVIDER: Rosario Knee [address] [place],  [place] [address]  PRIMARY PROVIDER:  Jama Chow, MD  PRIMARY REASON FOR VISIT:  1. Family history of colon cancer   2. Family history of breast cancer   3. Family history of melanoma   4. Family history of lung cancer   5. Family history of leukemia   6. Family history of cervical cancer   7. Family history of bladder cancer     HISTORY OF PRESENT ILLNESS:   Ms. Rapozo, a 38 y.o. female, was seen on 01/16/2024 for a [place] cancer genetics consultation at the request of Knee Rosario, DNP at [facility] due to a family history of colon cancer and other cancers.  Ms. Bai presents to clinic today to discuss the possibility of a hereditary predisposition to cancer, genetic testing, and to further clarify her future cancer risks, as well as potential cancer risks for family members.   CANCER HISTORY:   Ms. Beale is a 38 y.o. female with no personal history of cancer. 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   RELEVANT MEDICAL HISTORY AND RISK FACTORS:  Menarche was at age 89.  First live birth at age 89.  OCP use for approximately one month Ovaries intact: yes.  Hysterectomy: no.  Menopausal status: premenopausal.  HRT use: 0 years. Mammogram within the last year: no. She report she was recommended to have one starting at age 52 due to her mother's diagnosis of breast cancer at age 56, but she has repeatedly declined.  Breast density: not assessed Number of breast biopsies: 0. Up to date with pelvic exams: yes. Hx of cervical dysplasia, s/p LEEP Colonoscopy: no; She reports she was offered a colonoscopy due to diverticulitis but declined. Any excessive radiation exposure in the past: no Other cancer screenings: no.  Exposures: no.   Past Medical History:  Diagnosis Date   ADHD (attention deficit hyperactivity disorder)    Allergy    Anxiety    Anxiety    Bipolar 1 disorder (HCC)     BV (bacterial vaginosis)    Cervical dysplasia    Depression    Family history of bladder cancer    Family history of breast cancer    Family history of cervical cancer    Family history of colon cancer    Family history of leukemia    Family history of lung cancer    Family history of melanoma    Hypothyroidism    PTSD (post-traumatic stress disorder)     Past Surgical History:  Procedure Laterality Date   TUBAL LIGATION      Social History   Socioeconomic History   Marital status: Single    Spouse name: Not on file   Number of children: 5   Years of education: Not on file   Highest education level: High school graduate  Occupational History   Occupation: HOME HEALTH  Tobacco Use   Smoking status: Former    Current packs/day: 1.00    Average packs/day: 1 pack/day for 16.0 years (16.0 ttl pk-yrs)    Types: Cigarettes   Smokeless tobacco: Never   Tobacco comments:    7 Cigarettes per day- reported 12/31/2019  Vaping Use   Vaping status: Every Day  Substance and Sexual Activity   Alcohol use: Not Currently   Drug use: Never   Sexual activity: Yes    Birth control/protection: Condom  Other Topics Concern   Not on file  Social  History Narrative   Not on file   Social Drivers of Health   Financial Resource Strain: Medium Risk (12/25/2018)   Overall Financial Resource Strain (CARDIA)    Difficulty of Paying Living Expenses: Somewhat hard  Food Insecurity: Food Insecurity Present (12/25/2018)   Hunger Vital Sign    Worried About Running Out of Food in the Last Year: Sometimes true    Ran Out of Food in the Last Year: Sometimes true  Transportation Needs: No Transportation Needs (12/25/2018)   PRAPARE - Administrator, Civil Service (Medical): No    Lack of Transportation (Non-Medical): No  Physical Activity: Inactive (12/25/2018)   Exercise Vital Sign    Days of Exercise per Week: 0 days    Minutes of Exercise per Session: 0 min  Stress: Stress  Concern Present (12/25/2018)   [employer] of [employer]ational [employer] - Occupational Stress Questionnaire    Feeling of Stress : Rather much  Social Connections: Unknown (08/16/2022)   Received from [facility]   Social Connections    How often do you feel lonely or isolated from those around you? (Adult - for ages 62 years and over): Not on file     FAMILY HISTORY:  We obtained a detailed, 4-generation family history.  Significant diagnoses are listed below: Family History  Problem Relation Age of Onset   Breast cancer Mother 80       s/p lumpectomy   COPD Mother    Leukemia Father 70 - 77   Bladder Cancer Father 65 - 33   Colon cancer Father 85   Lung cancer Father 9 - 16   Anxiety disorder Sister    Depression Sister    Alcohol abuse Brother    Drug abuse Brother    Melanoma Maternal Aunt 46 - 6   Cancer - Cervical Maternal Cousin    Kidney cancer Maternal Grandmother        potentially had kidney cancer   Colon cancer Maternal Grandfather     Ms. Justo reports the following family history: Her sister Orlean reportedly has a brain tumor that was diagnosed at age 3. She also reports her sister got genetic testing done based on their mothers history of breast cancer but she does not know the results.  Her mother was diagnosed with breast cancer at age 63 and she had a lumpectomy. She is not sure if her mother got any genetic testing. She lives in [place] . Her maternal aunt Marval was diagnosed with melanoma in her 25s. She is not sure where on the body it was found. This aunt has a daughter Nena who had cervical cancer. Her maternal grandmother was diagnosed with kidney cancer Her maternal grandfather was diagnosed with colon cancer and stomach cancer  Her father was diagnosed with leukemia in his 58s, bladder cancer between ages 27-65, colon cancer at 59, and lung cancer in his late 13s. She reports he was in the [employer] up until he got leukemia and started the chemo  pill, which he still takes. She does not know if her father ever got genetic testing. She has limited knowledge about her paternal family history   There is no reported Ashkenazi Jewish ancestry. There is no known consanguinity.  GENETIC COUNSELING ASSESSMENT:  Ms. Haydon is a 38 y.o. female with a maternal family history of breast cancer, melanoma, and colon cancer and a paternal family history of leukemia, colon cancer, bladder cancer, and lung cancer. This history is somewhat suggestive of a hereditary  cancer syndrome and predisposition to cancer. We, therefore, discussed and recommended the following at today's visit.   DISCUSSION: We discussed that, in general, most cancer is not inherited in families, but instead is sporadic or familial. Sporadic cancers occur by chance and typically happen at older ages (>50 years) as this type of cancer is caused by genetic changes acquired during an individual's lifetime. Some families have more cancers than would be expected by chance; however, the ages or types of cancer are not consistent with a known genetic mutation or known genetic mutations have been ruled out. This type of familial cancer is thought to be due to a combination of multiple genetic, environmental, hormonal, and lifestyle factors. While this combination of factors likely increases the risk of cancer, the exact source of this risk is not currently identifiable or testable.  We discussed that 5 - 10% of some cancers are hereditary. Most cases of hereditary breast cancer are associated with BRCA1 and BRCA2 genes, although there are other genes associated with hereditary breast cancer as well. Some cancers are more likely to be hereditary, such as breast cancer, colon cancer, and melanoma, which are cancers present in her family history. There are other cancers that are more commonly hereditary, such as ovarian and pancreatic cancer that we are not seeing in her family. Not all cancers are  hereditary, and we discussed that cervical cancer and lung cancer do not typically have hereditary components identifiable on genetic testing. Cancer risks and management strategies are gene specific. We discussed that genetic testing can beneficial for several reasons, including clarifying specific cancer risks, identifying potential screening (such as breast screening) and risk-reduction options that may be appropriate, and to understand if other family members could be at risk for cancer and allow them to undergo genetic testing to clarify their cancer risks.   We reviewed the characteristics, features and inheritance patterns of hereditary cancer syndromes. We also discussed genetic testing, including the appropriate family members to test, the process of testing, insurance coverage and turn-around-time for results. We discussed the implications of a negative, positive, carrier and/or variant of uncertain significant result.   We discussed that we recommended starting with genetic testing for the family members that are affected with cancer, which would be her mother and her father. Genetic testing for these affected relatives would help clarify her own risk and genetic testing results. Ms. Furno chose to proceed with genetic testing today for herself.   Ms. Leisey was offered the [facility] CancerNext + RNAinsight gene panel which includes sequencing, rearrangement analysis, and RNA analysis for the following 40 genes: APC, ATM, BAP1, BARD1, BMPR1A, BRCA1, BRCA2, BRIP1, CDH1, CDKN2A, CHEK2, FH, FLCN, MET, MLH1, MSH2, MSH6, MUTYH, NF1, NTHL1, PALB2, PMS2, PTEN, RAD51C, RAD51D, RPS20, SMAD4, STK11, TP53, TSC1, TSC2, and VHL (sequencing and deletion/duplication); AXIN2, HOXB13, MBD4, MSH3, POLD1 and POLE (sequencing only); EPCAM and GREM1 (deletion/duplication only).   Ms. Laufer was also offered the [facility] CancerNext-Expanded + RNAinsight gene panel which includes sequencing, rearrangement, and RNA analysis  for the following 77 genes: AIP, ALK, APC, ATM, AXIN2, BAP1, BARD1, BMPR1A, BRCA1, BRCA2, BRIP1, CDC73, CDH1, CDK4, CDKN1B, CDKN2A, CEBPA, CHEK2, CTNNA1, DDX41, DICER1, ETV6, FH, FLCN, GATA2, LZTR1, MAX, MBD4, MEN1, MET, MLH1, MSH2, MSH3, MSH6, MUTYH, NF1, NF2, NTHL1, PALB2, PHOX2B, PMS2, POT1, PRKAR1A, PTCH1, PTEN, RAD51C, RAD51D, RB1, RET, RPS20, RUNX1, SDHA, SDHAF2, SDHB, SDHC, SDHD, SMAD4, SMARCA4, SMARCB1, SMARCE1, STK11, SUFU, TMEM127, TP53, TSC1, TSC2, VHL, and WT1 (sequencing and deletion/duplication); EGFR, HOXB13, KIT, MITF, PDGFRA,  POLD1, and POLE (sequencing only); EPCAM and GREM1 (deletion/duplication only).  Ms. Kramar was interested in doing this larger panel since genes related to the endocrine system and blood malignancies are included, but did not want to know about risk for central nervous system tumors. I confirmed with Ms. Magri genetic testing for genes related to both CNS tumors and cancers that are in her family history, plus endocrine and blood cancers was okay. Because of this, I offered to do a custom panel starting with the CancerNext- Expanded + RNA panel and removing genes related to central nervous sytem tumors. I ordered the [facility] CustomNext- Cancer + RNA insight gene panel which includes sequencing, rearrangement, and RNA analysis for the following 68 genes: APC, ATM, AXIN2, BAP1, BARD1, BMPR1A, BRCA1, BRCA2, BRIP1, CDC73, CDH1, CDK4, CDKN1B, CDKN2A, CEBPA, CHEK2, CTNNA1, DDX41, ETV6, FH, FLCN, GATA2, MAX, MBD4, MEN1, MET, MLH1, MSH2, MSH3, MSH6, MUTYH, NF1, NTHL1, PALB2, PMS2, POT1, PRKAR1A, PTEN, RAD51C, RAD51D, RB1, RET, RPS20, RUNX1, SDHA, SDHAF2, SDHB, SDHC, SDHD, SMAD4, SMARCA4, SMARCB1, STK11, TMEM127, TP53, TSC1, TSC2, VHL, and WT1 (sequencing and deletion/duplication); EGFR, HOXB13, KIT, MITF, PDGFRA, POLD1, and POLE (sequencing only); EPCAM and GREM1 (deletion/duplication only).  Ms. Merriman was informed of the benefits and limitations of each panel, including that  expanded pan-cancer panels contain genes may not have clear management guidelines at this point in time. We also discussed that as the number of genes included on a panel increases, the chances of variants of uncertain significance increases. After considering the risks, benefits, and limitations, Ms. Romano provided informed consent to pursue genetic testing. Ms. Alaniz decided to pursue genetic testing for the [facility] CustomNext- Cancer + RNA gene panel consisting of 68 genes.   Based on Ms. Peplinski's family history of cancer, she meets medical criteria for genetic testing. she meets criteria due to her mother being diagnosed with breast cancer under the age of 50 and due to her father diagnosis of metasynchrous lynch syndrome cancers (colon and bladder cancers). Though Ms. Diniz is not personally affected, there are no affected family members that are willing/able/available to undergo hereditary cancer testing.  Therefore, Ms. Cedar the most informative family member available. Despite that she meets criteria, she may still have an out of pocket cost. We discussed that if her out of pocket cost for testing is over $100, the laboratory will call and confirm whether she wants to proceed with testing.  If the out of pocket cost of testing is less than $100 she will be billed by the genetic testing laboratory.   We discussed that some people do not want to undergo genetic testing due to fear of genetic discrimination.  The Genetic Information Nondiscrimination Act (GINA) was signed into federal law in 2008. GINA prohibits health insurers and most employers from discriminating against individuals based on genetic information (including the results of genetic tests and family history information). According to GINA, health insurance companies cannot consider genetic information to be a preexisting condition, nor can they use it to make decisions regarding coverage or rates. GINA also makes it illegal for most  employers to use genetic information in making decisions about hiring, firing, promotion, or terms of employment. It is important to note that GINA does not offer protections for life insurance, disability insurance, or long-term care insurance. GINA does not apply to those in the [employer], those who work for companies with less than 15 employees, and new life insurance or long-term disability insurance policies.  Health status due to a cancer  diagnosis is not protected under GINA. More information about GINA can be found by visiting [email or web address].  PLAN: After considering the risks, benefits, and limitations, Ms. Corrie provided informed consent to pursue genetic testing and the blood sample was sent to [facility] for analysis of the [facility] CustomNext- Cancer + RNA gene panel consisting of 68 genes. Results should be available within approximately 2-3 weeks' time, at which point they will be disclosed by telephone to Ms. Cleotilde, as will any additional recommendations warranted by these results. Ms. Granberry will receive a summary of her genetic counseling visit and a copy of her results once available. This information will also be available in Epic.   RESOURCES PROVIDED:  Ms. Guilbault was provided with the following:  [facility] Billing information  [facility] Contact card   Lastly, we encouraged Ms. Connell to remain in contact with cancer genetics annually so that we can continuously update the family history and inform her of any changes in cancer genetics and testing that may be of benefit for this family.   Ms. Mcdevitt questions were answered to her satisfaction today. Our contact information was provided should additional questions or concerns arise. Thank you for the referral and allowing us  to share in the care of your patient.   Evelean Bigler R. Bluford, MS, [facility] Certified [email or web address] phone: [phone]  I personally spent a total  of 65 minutes in the care of the patient today including preparing to see the patient, getting/reviewing separately obtained history, counseling and educating, placing orders, and documenting clinical information in the EHR.  The patient was seen alone.  Drs. Lanny Stalls, and/or Gudena were available for questions, if needed.   _______________________________________________________________________ For Office Staff:  Number of people involved in session: 1 Was an Intern/ student involved with case: no

## 2024-01-30 ENCOUNTER — Ambulatory Visit: Payer: Self-pay

## 2024-01-30 ENCOUNTER — Telehealth: Payer: Self-pay

## 2024-01-30 DIAGNOSIS — Z1379 Encounter for other screening for genetic and chromosomal anomalies: Secondary | ICD-10-CM | POA: Insufficient documentation

## 2024-01-30 DIAGNOSIS — Z808 Family history of malignant neoplasm of other organs or systems: Secondary | ICD-10-CM

## 2024-01-30 DIAGNOSIS — Z800 Family history of malignant neoplasm of digestive organs: Secondary | ICD-10-CM

## 2024-01-30 DIAGNOSIS — Z8049 Family history of malignant neoplasm of other genital organs: Secondary | ICD-10-CM

## 2024-01-30 DIAGNOSIS — Z801 Family history of malignant neoplasm of trachea, bronchus and lung: Secondary | ICD-10-CM

## 2024-01-30 DIAGNOSIS — Z8052 Family history of malignant neoplasm of bladder: Secondary | ICD-10-CM

## 2024-01-30 DIAGNOSIS — Z803 Family history of malignant neoplasm of breast: Secondary | ICD-10-CM

## 2024-01-30 DIAGNOSIS — Z806 Family history of leukemia: Secondary | ICD-10-CM

## 2024-01-30 NOTE — Progress Notes (Addendum)
 HPI:  Ms. Castello was previously seen in the  Cancer Genetics clinic due to a family history of breast cancer, colon cancer, and other cancers, and concerns regarding a hereditary predisposition to cancer. Please refer to our prior cancer genetics clinic note for more information regarding our discussion, assessment and recommendations, at the time. Ms. Wooden recent genetic test results were disclosed to her, as were recommendations warranted by these results. These results and recommendations are discussed in more detail below.  CANCER HISTORY:  Ms. Liberati is a 38 y.o. female with no personal history of cancer. 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   FAMILY HISTORY:  We obtained a detailed, 4-generation family history.  Significant diagnoses are listed below: Family History  Problem Relation Age of Onset   Breast cancer Mother 22       s/p lumpectomy   COPD Mother    Leukemia Father 56 - 13   Bladder Cancer Father 73 - 57   Colon cancer Father 96   Lung cancer Father 61 - 65   Anxiety disorder Sister    Depression Sister    Alcohol abuse Brother    Drug abuse Brother    Melanoma Maternal Aunt 75 - 87   Cancer - Cervical Maternal Cousin    Kidney cancer Maternal Grandmother        potentially had kidney cancer   Colon cancer Maternal Grandfather      Ms. Alcorta reports the following family history: Her sister Orlean reportedly has a brain tumor that was diagnosed at age 26. She also reports her sister got genetic testing done based on their mothers history of breast cancer but she does not know the results.  Her mother was diagnosed with breast cancer at age 84 and she had a lumpectomy. She is not sure if her mother got any genetic testing. She lives in [place] . Her maternal aunt Marval was diagnosed with melanoma in her 55s. She is not sure where on the body it was found. This aunt has a daughter Nena who had cervical cancer. Her maternal  grandmother was diagnosed with kidney cancer Her maternal grandfather was diagnosed with colon cancer and stomach cancer   Her father was diagnosed with leukemia in his 11s, bladder cancer between ages 57-65, colon cancer at 68, and lung cancer in his late 83s. She reports he was in the [employer] up until he got leukemia and started the chemo pill, which he still takes. She does not know if her father ever got genetic testing. She has limited knowledge about her paternal family history    There is no reported Ashkenazi Jewish ancestry. There is no known consanguinity.  GENETIC TEST RESULTS: Genetic testing reported out on 01/26/2024 through the [facility]- Cancer + RNA insight gene panel which includes sequencing, rearrangement, and RNA analysis for the following 68 genes: APC, ATM, AXIN2, BAP1, BARD1, BMPR1A, BRCA1, BRCA2, BRIP1, CDC73, CDH1, CDK4, CDKN1B, CDKN2A, CEBPA, CHEK2, CTNNA1, DDX41, ETV6, FH, FLCN, GATA2, MAX, MBD4, MEN1, MET, MLH1, MSH2, MSH3, MSH6, MUTYH, NF1, NTHL1, PALB2, PMS2, POT1, PRKAR1A, PTEN, RAD51C, RAD51D, RB1, RET, RPS20, RUNX1, SDHA, SDHAF2, SDHB, SDHC, SDHD, SMAD4, SMARCA4, SMARCB1, STK11, TMEM127, TP53, TSC1, TSC2, VHL, and WT1 (sequencing and deletion/duplication); EGFR, HOXB13, KIT, MITF, PDGFRA, POLD1, and POLE (sequencing only); EPCAM and GREM1 (deletion/duplication only).   This cancer panel found no pathogenic mutations in any of the 68 genes analyzed. The test report has been scanned into EPIC and is located under  the Molecular Pathology section of the Results Review tab.  A portion of the result report is included below for reference.     We discussed with Ms. Madry that because current genetic testing is not perfect, it is possible there may be a gene mutation in one of these genes that current testing cannot detect, but that chance is small.  We also discussed, that there could be another gene that has not yet been discovered, or that we have not yet tested,  that is responsible for the cancer diagnoses in the family. It is also possible there is a hereditary cause for the cancer in the family that Ms. Pettie did not inherit and therefore was not identified in her testing.  Therefore, it is important to remain in touch with cancer genetics in the future so that we can continue to offer Ms. Heinrichs the most up to date genetic testing.   ADDITIONAL GENETIC TESTING: We discussed with Ms. Lutterman that her genetic testing was fairly extensive.  If there are genes identified to increase cancer risk that can be analyzed in the future, we would be happy to discuss and coordinate this testing at that time.    Ms. Stroud asked if the cervical cancer genes were included on this testing. We discussed that there are no genes that have been identified at this time to increase the risk for only cervical cancer, however the DICER1 and STK11 genes which were included on this panel, can increase the risk for cervical cancer but this is not the primary cancer type associated with these genes. The most important risk factor for cervical cancer is HPV infection, but there are other risk factors as well.  CANCER SCREENING RECOMMENDATIONS: Ms. Creasman test result is considered negative (normal).  This means that we have not identified a hereditary cause for her family history of cancers at this time. Most cancers happen by chance and this negative test suggests that her family history of cancers may fall into this category.    Possible reasons for Ms. Quilter's negative genetic test include:  1. There may be a gene mutation in one of these genes that current testing methods cannot detect but that chance is small.  2. There could be another gene that has not yet been discovered, or that we have not yet tested, that is responsible for the cancer diagnoses in the family.  3.  There may be no hereditary risk for cancer in the family. The cancers in Ms. Mounsey and/or her family may be  sporadic/familial or due to other genetic and environmental factors. 4. It is also possible there is a hereditary cause for the cancer in the family that Ms. Teachey did not inherit.  Therefore, it is recommended she continue to follow the cancer management and screening guidelines provided by her primary healthcare provider. An individual's cancer risk and medical management are not determined by genetic test results alone. Overall cancer risk assessment incorporates additional factors, including personal medical history, family history, and any available genetic information that may result in a personalized plan for cancer prevention and surveillance  An individual's cancer risk and medical management are not determined by genetic test results alone. Overall cancer risk assessment incorporates additional factors, including personal medical history, family history, and any available genetic information that may result in a personalized plan for cancer prevention and surveillance.  he Tyrer-Cuzick model is one of multiple prediction models developed to estimate an individual's lifetime risk of developing breast cancer. The  Tyrer-Cuzick model is endorsed by the [employer] ([employer]CC[employer]). This model includes many risk factors such as family history, endogenous estrogen exposure, and benign breast disease. The calculation is highly-dependent on the accuracy of clinical data provided by the patient and can change over time. The Tyrer-Cuzick model may be repeated to reflect new information in her personal or family history in the future.   Ms. Cly Tyrer-Cuzick risk score is 13.6%. For women with a greater than 20% lifetime risk of breast cancer, the [employer] (NCCN) recommends high risk breast screening. We discussed even though the risk score is estimated to be a less than 20% lifetime risk for breast cancer, we still recommend Ms. Friedl begin her annual  mammograms 10 years earlier than the youngest breast cancer diagnosis in the family or at age 70 (whichever comes first). Since her mother was diagnosed at age 91, we recommend she start annual breast mammograms now. Ms. Crocker verbalized understanding.       RECOMMENDATIONS FOR FAMILY MEMBERS:   Since she did not inherit a identifiable mutation in a cancer predisposition gene included on this panel, her children could not have inherited a known mutation from her in one of these genes. Individuals in this family might be at some increased risk of developing cancer, over the general population risk, simply due to the family history of cancer.  We recommended women in this family have a yearly mammogram beginning at age 54, or 46 years younger than the earliest onset of cancer, an annual clinical breast exam, and perform monthly breast self-exams. Women in this family should also have a gynecological exam as recommended by their primary provider. All family members should be referred for colonoscopy starting at age 82, or 48 years younger than the earliest onset of cancer. It is also possible there is a hereditary cause for the cancer in Ms. Robertshaw's family that she did not inherit and therefore was not identified in her.  Based on Ms. Barbero's family history, we recommended her mother, who was diagnosed with breast cancer at age 19, have genetic counseling and testing. Ms. Hizer informed me that she spoke with her mother about genetic testing and she said her mother would want to do it but isn't sure how to get started since she lives in [place] . I suggested that Ms. Fair encourage her mother to get in contact with her breast oncologist or her PCP and request genetic counseling and testing due to her diagnosis of breast cancer under the age of 80. I provided an alternative suggestion to visit the [employer]) website and use their Find a Chief Technology Officer  (https://www.figueroa.com/) to find a dentist in [place] . Ms. Degroote will let us  know if we can be of any assistance in coordinating genetic counseling and/or testing for this family member.  FOLLOW-UP: Lastly, we discussed with Ms. Mccaughey that cancer genetics is a rapidly advancing field and it is possible that new genetic tests will be appropriate for her and/or her family members in the future. We encouraged her to remain in contact with cancer genetics on an annual basis so we can update her personal and family histories and let her know of advances in cancer genetics that may benefit this family.   Per Ms. Cleotilde request, these genetic testing results were faxed to her provider Rosina MYRTIS Darting, DNP, AGNP-C, [facility], [facility], Fax: [phone].  Our contact number was provided. Ms.  Markgraf's questions were answered to her satisfaction, and she knows she is welcome to call us  at anytime with additional questions or concerns.   Warren Ahle, MS, [facility] Cancer Genetic Counselor [email or web address] [phone]

## 2024-01-30 NOTE — Telephone Encounter (Signed)
 I contacted Ms. Layne to discuss her genetic testing results. The test that was ordered was the [facility]- Cancer + RNA insight gene panel which includes sequencing, rearrangement, and RNA analysis for the following 68 genes: APC, ATM, AXIN2, BAP1, BARD1, BMPR1A, BRCA1, BRCA2, BRIP1, CDC73, CDH1, CDK4, CDKN1B, CDKN2A, CEBPA, CHEK2, CTNNA1, DDX41, ETV6, FH, FLCN, GATA2, MAX, MBD4, MEN1, MET, MLH1, MSH2, MSH3, MSH6, MUTYH, NF1, NTHL1, PALB2, PMS2, POT1, PRKAR1A, PTEN, RAD51C, RAD51D, RB1, RET, RPS20, RUNX1, SDHA, SDHAF2, SDHB, SDHC, SDHD, SMAD4, SMARCA4, SMARCB1, STK11, TMEM127, TP53, TSC1, TSC2, VHL, and WT1 (sequencing and deletion/duplication); EGFR, HOXB13, KIT, MITF, PDGFRA, POLD1, and POLE (sequencing only); EPCAM and GREM1 (deletion/duplication only). This result was reported on 01/26/2024. No pathogenic variants were identified in the 68 genes analyzed. Detailed clinic note to follow.   The test report has been scanned into EPIC and is located under the Molecular Pathology section of the Results Review tab.  A portion of the result report is included below for reference.      Warren Ahle, MS, [facility] Cancer Genetic Counselor [email or web address] [phone]
